# Patient Record
Sex: Female | Born: 1990 | Race: White | Hispanic: No | Marital: Single | Smoking: Former smoker
Health system: Southern US, Community
[De-identification: ages and names within clinical notes are randomized; demographics above are authoritative.]

## PROBLEM LIST (undated history)

## (undated) DIAGNOSIS — D649 Anemia, unspecified: Secondary | ICD-10-CM

## (undated) DIAGNOSIS — F419 Anxiety disorder, unspecified: Secondary | ICD-10-CM

## (undated) DIAGNOSIS — E349 Endocrine disorder, unspecified: Secondary | ICD-10-CM

## (undated) DIAGNOSIS — F32A Depression, unspecified: Secondary | ICD-10-CM

## (undated) DIAGNOSIS — F329 Major depressive disorder, single episode, unspecified: Secondary | ICD-10-CM

## (undated) DIAGNOSIS — F445 Conversion disorder with seizures or convulsions: Secondary | ICD-10-CM

## (undated) DIAGNOSIS — G40209 Localization-related (focal) (partial) symptomatic epilepsy and epileptic syndromes with complex partial seizures, not intractable, without status epilepticus: Secondary | ICD-10-CM

## (undated) DIAGNOSIS — A64 Unspecified sexually transmitted disease: Secondary | ICD-10-CM

## (undated) HISTORY — PX: TONSILLECTOMY: SUR1361

## (undated) HISTORY — DX: Localization-related (focal) (partial) symptomatic epilepsy and epileptic syndromes with complex partial seizures, not intractable, without status epilepticus: G40.209

## (undated) HISTORY — DX: Endocrine disorder, unspecified: E34.9

## (undated) HISTORY — DX: Unspecified sexually transmitted disease: A64

## (undated) HISTORY — DX: Anemia, unspecified: D64.9

---

## 2009-11-15 ENCOUNTER — Emergency Department (HOSPITAL_COMMUNITY): Admission: EM | Admit: 2009-11-15 | Discharge: 2009-11-16 | Payer: Self-pay | Admitting: Emergency Medicine

## 2010-07-24 ENCOUNTER — Emergency Department (HOSPITAL_COMMUNITY)
Admission: EM | Admit: 2010-07-24 | Discharge: 2010-07-25 | Payer: Self-pay | Source: Home / Self Care | Admitting: Emergency Medicine

## 2010-11-14 LAB — WET PREP, GENITAL

## 2010-11-14 LAB — URINALYSIS, ROUTINE W REFLEX MICROSCOPIC
Glucose, UA: NEGATIVE mg/dL
Ketones, ur: NEGATIVE mg/dL
Nitrite: NEGATIVE
Protein, ur: NEGATIVE mg/dL

## 2010-11-14 LAB — GC/CHLAMYDIA PROBE AMP, GENITAL: Chlamydia, DNA Probe: NEGATIVE

## 2010-11-14 LAB — URINE CULTURE
Colony Count: NO GROWTH
Culture: NO GROWTH

## 2010-11-14 LAB — PREGNANCY, URINE: Preg Test, Ur: NEGATIVE

## 2010-11-14 LAB — URINE MICROSCOPIC-ADD ON

## 2011-01-28 ENCOUNTER — Emergency Department (HOSPITAL_COMMUNITY)
Admission: EM | Admit: 2011-01-28 | Discharge: 2011-01-29 | Disposition: A | Discharge status: Home / Self Care | Payer: Self-pay | Attending: Emergency Medicine | Admitting: Emergency Medicine

## 2011-01-28 DIAGNOSIS — R300 Dysuria: Secondary | ICD-10-CM | POA: Insufficient documentation

## 2011-01-29 LAB — URINALYSIS, ROUTINE W REFLEX MICROSCOPIC
Leukocytes, UA: NEGATIVE
Nitrite: NEGATIVE
Specific Gravity, Urine: 1.015 (ref 1.005–1.030)
Urobilinogen, UA: 0.2 mg/dL (ref 0.0–1.0)

## 2011-01-29 LAB — URINE MICROSCOPIC-ADD ON

## 2011-01-31 LAB — URINE CULTURE
Colony Count: NO GROWTH
Culture: NO GROWTH

## 2011-03-11 ENCOUNTER — Emergency Department (HOSPITAL_COMMUNITY)
Admission: EM | Admit: 2011-03-11 | Discharge: 2011-03-11 | Disposition: A | Discharge status: Home / Self Care | Payer: Self-pay | Attending: Emergency Medicine | Admitting: Emergency Medicine

## 2011-03-11 ENCOUNTER — Encounter: Payer: Self-pay | Admitting: *Deleted

## 2011-03-11 DIAGNOSIS — M549 Dorsalgia, unspecified: Secondary | ICD-10-CM | POA: Insufficient documentation

## 2011-03-11 DIAGNOSIS — F17200 Nicotine dependence, unspecified, uncomplicated: Secondary | ICD-10-CM | POA: Insufficient documentation

## 2011-03-11 DIAGNOSIS — N390 Urinary tract infection, site not specified: Secondary | ICD-10-CM

## 2011-03-11 HISTORY — DX: Depression, unspecified: F32.A

## 2011-03-11 HISTORY — DX: Anxiety disorder, unspecified: F41.9

## 2011-03-11 HISTORY — DX: Major depressive disorder, single episode, unspecified: F32.9

## 2011-03-11 LAB — URINALYSIS, ROUTINE W REFLEX MICROSCOPIC
Ketones, ur: NEGATIVE mg/dL
Nitrite: NEGATIVE

## 2011-03-11 LAB — URINE MICROSCOPIC-ADD ON

## 2011-03-11 LAB — PREGNANCY, URINE: Preg Test, Ur: NEGATIVE

## 2011-03-11 MED ORDER — CIPROFLOXACIN HCL 250 MG PO TABS
500.0000 mg | ORAL_TABLET | Freq: Once | ORAL | Status: AC
  Administered 2011-03-11: 500 mg via ORAL
  Filled 2011-03-11 (×2): qty 2

## 2011-03-11 MED ORDER — CIPROFLOXACIN HCL 500 MG PO TABS: 500.0000 mg | ORAL_TABLET | Freq: Two times a day (BID) | ORAL | Status: AC

## 2011-03-11 MED ORDER — NITROFURANTOIN MONOHYD MACRO 100 MG PO CAPS
100.0000 mg | ORAL_CAPSULE | Freq: Two times a day (BID) | ORAL | Status: DC
  Filled 2011-03-11 (×3): qty 1

## 2011-03-11 MED ORDER — NITROFURANTOIN MACROCRYSTAL 100 MG PO CAPS
100.0000 mg | ORAL_CAPSULE | Freq: Once | ORAL | Status: AC
  Administered 2011-03-11: 100 mg via ORAL
  Filled 2011-03-11: qty 1

## 2011-03-11 MED ORDER — PHENAZOPYRIDINE HCL 100 MG PO TABS
100.0000 mg | ORAL_TABLET | Freq: Three times a day (TID) | ORAL | Status: DC
  Administered 2011-03-11 (×2): 100 mg via ORAL
  Filled 2011-03-11: qty 1

## 2011-03-11 NOTE — ED Notes (Signed)
PT states back pain x 3 days since being in the [place] and riding a boat snorkeling.  Pt states "whole spine hurts, from neck down"

## 2011-03-11 NOTE — ED Notes (Signed)
Pyridium only given x 1 dose

## 2011-03-11 NOTE — ED Notes (Signed)
Pt states she has been having back pain x 3 days. States her back started hurting past snorkling and a rough bus trip. Denies any urinary symptoms

## 2011-03-11 NOTE — ED Provider Notes (Signed)
History     Chief Complaint  Patient presents with  . Back Pain   Patient is a 20 y.o. female presenting with back pain. The history is provided by the patient. No language interpreter was used.  Back Pain  The current episode started more than 2 days ago. The problem occurs constantly. The problem has not changed since onset.The pain is associated with no known injury. The pain is present in the thoracic spine, lumbar spine and sacro-iliac joint. The quality of the pain is described as stabbing. The pain does not radiate. The pain is moderate. The symptoms are aggravated by bending. The pain is the same all the time. Pertinent negatives include no fever, no dysuria, no pelvic pain, no leg pain, no paresthesias, no paresis, no tingling and no weakness.    Past Medical History  Diagnosis Date  . Depression   . Anxiety     Past Surgical History  Procedure Date  . Tonsillectomy     No family history on file.  History  Substance Use Topics  . Smoking status: Current Everyday Smoker  . Smokeless tobacco: Not on file  . Alcohol Use: No    OB History    Grav Para Term Preterm Abortions TAB SAB Ect Mult Living                  Review of Systems  Constitutional: Negative for fever.  Genitourinary: Negative for dysuria and pelvic pain.  Musculoskeletal: Positive for back pain.       States she has bony pain fro c-soine to LS spine but most pain appears to be paravertebral and is worse with neck flexion.  Neurological: Negative for tingling, weakness and paresthesias.  All other systems reviewed and are negative.    Physical Exam  BP 122/81  Pulse 85  Temp(Src) 98.3 F (36.8 C) (Oral)  Resp 16  Ht 5\' 3"  (1.6 m)  Wt 110 lb (49.896 kg)  BMI 19.49 kg/m2  SpO2 99%  LMP 03/04/2011  Physical Exam  Nursing note and vitals reviewed. Constitutional: She is oriented to person, place, and time. Vital signs are normal. She appears well-developed and well-nourished.  HENT:    Head: Normocephalic and atraumatic.  Right Ear: External ear normal.  Left Ear: External ear normal.  Nose: Nose normal.  Mouth/Throat: No oropharyngeal exudate.  Eyes: Conjunctivae and EOM are normal. Pupils are equal, round, and reactive to light. Right eye exhibits no discharge. Left eye exhibits no discharge. No scleral icterus.  Neck: Normal range of motion. Neck supple. No JVD present. No tracheal deviation present. No thyromegaly present.  Cardiovascular: Normal rate, regular rhythm, normal heart sounds, intact distal pulses and normal pulses.  Exam reveals no gallop and no friction rub.   No murmur heard. Pulmonary/Chest: Effort normal and breath sounds normal. No stridor. No respiratory distress. She has no wheezes. She has no rales. She exhibits no tenderness.  Abdominal: Soft. Normal appearance and bowel sounds are normal. She exhibits no distension and no mass. There is no tenderness. There is no rebound and no guarding.  Genitourinary:       LMP last week.  + nuvaring  Musculoskeletal: Normal range of motion. She exhibits no edema and no tenderness.       Thoracic back: She exhibits tenderness, bony tenderness and pain. She exhibits normal range of motion, no swelling, no deformity and no spasm.       Back:  Lymphadenopathy:    She has no cervical  adenopathy.  Neurological: She is alert and oriented to person, place, and time. She has normal strength and normal reflexes. No cranial nerve deficit. Coordination normal. GCS eye subscore is 4. GCS verbal subscore is 5. GCS motor subscore is 6.  Reflex Scores:      Tricep reflexes are 2+ on the right side and 2+ on the left side.      Bicep reflexes are 2+ on the right side and 2+ on the left side.      Brachioradialis reflexes are 2+ on the right side and 2+ on the left side.      Patellar reflexes are 2+ on the right side and 2+ on the left side.      Achilles reflexes are 2+ on the right side and 2+ on the left side. Skin: Skin  is warm and dry. No rash noted. She is not diaphoretic.  Psychiatric: She has a normal mood and affect. Her speech is normal and behavior is normal. Judgment and thought content normal. Cognition and memory are normal.    ED Course  Procedures  MDM Pt appears to be in no acute distress.      Worthy Rancher, PA 03/11/11 1112

## 2011-03-12 LAB — URINE CULTURE

## 2011-03-12 NOTE — ED Provider Notes (Signed)
Evaluation and management procedures were performed by the PA/NP under my supervision/collaboration.   Dione Booze, MD 03/12/11 [phone]

## 2011-03-13 ENCOUNTER — Encounter (HOSPITAL_COMMUNITY): Payer: Self-pay

## 2011-04-14 ENCOUNTER — Emergency Department (HOSPITAL_COMMUNITY): Payer: Self-pay

## 2011-04-14 ENCOUNTER — Encounter (HOSPITAL_COMMUNITY): Payer: Self-pay | Admitting: *Deleted

## 2011-04-14 ENCOUNTER — Emergency Department (HOSPITAL_COMMUNITY)
Admission: EM | Admit: 2011-04-14 | Discharge: 2011-04-14 | Disposition: A | Discharge status: Home / Self Care | Payer: Self-pay | Attending: Emergency Medicine | Admitting: Emergency Medicine

## 2011-04-14 DIAGNOSIS — R110 Nausea: Secondary | ICD-10-CM | POA: Insufficient documentation

## 2011-04-14 DIAGNOSIS — F341 Dysthymic disorder: Secondary | ICD-10-CM | POA: Insufficient documentation

## 2011-04-14 DIAGNOSIS — F17200 Nicotine dependence, unspecified, uncomplicated: Secondary | ICD-10-CM | POA: Insufficient documentation

## 2011-04-14 DIAGNOSIS — R1011 Right upper quadrant pain: Secondary | ICD-10-CM | POA: Insufficient documentation

## 2011-04-14 DIAGNOSIS — K5900 Constipation, unspecified: Secondary | ICD-10-CM | POA: Insufficient documentation

## 2011-04-14 LAB — URINALYSIS, ROUTINE W REFLEX MICROSCOPIC
Glucose, UA: NEGATIVE mg/dL
Hgb urine dipstick: NEGATIVE
Protein, ur: NEGATIVE mg/dL

## 2011-04-14 LAB — CBC
HCT: 37.7 % (ref 36.0–46.0)
Hemoglobin: 12.9 g/dL (ref 12.0–15.0)
MCHC: 34.2 g/dL (ref 30.0–36.0)

## 2011-04-14 LAB — COMPREHENSIVE METABOLIC PANEL
ALT: 8 U/L (ref 0–35)
AST: 16 U/L (ref 0–37)
CO2: 26 mEq/L (ref 19–32)
Calcium: 10.1 mg/dL (ref 8.4–10.5)
Chloride: 105 mEq/L (ref 96–112)
GFR calc non Af Amer: >60 mL/min (ref >60)
Potassium: 3.5 mEq/L (ref 3.5–5.1)
Sodium: 141 mEq/L (ref 135–145)
Total Bilirubin: 0.4 mg/dL (ref 0.3–1.2)

## 2011-04-14 LAB — DIFFERENTIAL
Basophils Absolute: 0 10*3/uL (ref 0.0–0.1)
Lymphocytes Relative: 41 % (ref 12–46)
Neutro Abs: 3.7 10*3/uL (ref 1.7–7.7)
Neutrophils Relative %: 51 % (ref 43–77)

## 2011-04-14 LAB — PREGNANCY, URINE: Preg Test, Ur: NEGATIVE

## 2011-04-14 MED ORDER — MORPHINE SULFATE 4 MG/ML IJ SOLN
4.0000 mg | Freq: Once | INTRAMUSCULAR | Status: AC
  Administered 2011-04-14: 4 mg via INTRAVENOUS
  Filled 2011-04-14: qty 1

## 2011-04-14 MED ORDER — SODIUM CHLORIDE 0.9 % IV SOLN
20.0000 mL | INTRAVENOUS | Status: DC
  Administered 2011-04-14: 20 mL via INTRAVENOUS

## 2011-04-14 MED ORDER — ONDANSETRON HCL 4 MG/2ML IJ SOLN
4.0000 mg | Freq: Once | INTRAMUSCULAR | Status: AC
  Administered 2011-04-14: 4 mg via INTRAVENOUS
  Filled 2011-04-14: qty 2

## 2011-04-14 MED ORDER — POLYETHYLENE GLYCOL 3350 17 GM/SCOOP PO POWD: 17.0000 g | Freq: Every day | ORAL | Status: AC

## 2011-04-14 NOTE — ED Notes (Signed)
Meal tray provided per pt request.  

## 2011-04-14 NOTE — ED Notes (Signed)
Resting quietly in bed. Resp even and unlabored. Denies pain. NAd noted.

## 2011-04-14 NOTE — ED Notes (Signed)
Pt c/o RUQ abdominal pain and nausea off and on x 3 weeks. Also c/o "feeling full", loss of appetite and worsening pain after eating. Denies vomiting.

## 2011-04-14 NOTE — ED Provider Notes (Signed)
History     CSN: [id number] Arrival date & time: 04/14/2011  2:20 PM  Chief Complaint  Patient presents with  . Abdominal Pain   Patient is a 20 y.o. female presenting with abdominal pain. The history is provided by the patient.  Abdominal Pain The primary symptoms of the illness include abdominal pain and nausea. The primary symptoms of the illness do not include fever, shortness of breath, vomiting or diarrhea. The current episode started more than 2 days ago (She describes intermittent chronic right upper abdominal pain for the past year,  but worse for the past 3 weeks.). The onset of the illness was gradual. The problem has been gradually worsening.  The patient states that she believes she is currently not pregnant. The patient has had a change in bowel habit (constipation.  Describes having hard stools,  no bm in 2 days.  Alternates with diarrhea,  but none in ). Additional symptoms associated with the illness include constipation.    Past Medical History  Diagnosis Date  . Depression   . Anxiety     Past Surgical History  Procedure Date  . Tonsillectomy     History reviewed. No pertinent family history.  History  Substance Use Topics  . Smoking status: Current Everyday Smoker  . Smokeless tobacco: Not on file  . Alcohol Use: No    OB History    Grav Para Term Preterm Abortions TAB SAB Ect Mult Living                  Review of Systems  Constitutional: Negative for fever.  HENT: Negative for congestion, sore throat and neck pain.   Eyes: Negative.   Respiratory: Negative for chest tightness and shortness of breath.   Cardiovascular: Negative for chest pain.  Gastrointestinal: Positive for nausea, abdominal pain and constipation. Negative for vomiting, diarrhea, anal bleeding and rectal pain.  Genitourinary: Negative.   Musculoskeletal: Negative for joint swelling and arthralgias.  Skin: Negative.  Negative for rash and wound.  Neurological: Negative for  dizziness, weakness, light-headedness, numbness and headaches.  Hematological: Negative.   Psychiatric/Behavioral: Negative.     Physical Exam  BP 118/70  Pulse 70  Temp(Src) 98.2 F (36.8 C) (Oral)  Resp 17  Ht 5\' 4"  (1.626 m)  Wt 106 lb (48.081 kg)  BMI 18.19 kg/m2  SpO2 100%  LMP 03/13/2011  Physical Exam  Nursing note and vitals reviewed. Constitutional: She is oriented to person, place, and time. She appears well-developed and well-nourished.  HENT:  Head: Normocephalic and atraumatic.  Eyes: Conjunctivae are normal.  Neck: Normal range of motion.  Cardiovascular: Normal rate, regular rhythm, normal heart sounds and intact distal pulses.   Pulmonary/Chest: Effort normal and breath sounds normal. She has no wheezes.  Abdominal: Soft. Bowel sounds are normal. She exhibits no distension. There is no hepatosplenomegaly. There is tenderness in the right upper quadrant. There is no rebound, no CVA tenderness, no tenderness at McBurney's point and negative Murphy's sign. No hernia.  Musculoskeletal: Normal range of motion.  Neurological: She is alert and oriented to person, place, and time.  Skin: Skin is warm and dry.  Psychiatric: She has a normal mood and affect.    ED Course  Procedures  MDM Patients labs and/or radiological studies were reviewed during the medical decision making and disposition process.  Chronic intermittent abdominal pain with no signs or labwork findings for acute or emergent process.    Suspect possible ibs/constipation predominant with hepatic flexure gas  trapping causing intermittent pain.  No family history of UC/ crohns.  Referral to GI for further eval.      Candis Musa, PA 04/14/11 2008

## 2011-04-15 NOTE — ED Provider Notes (Signed)
History/physical exam/procedure(s) were performed by non-physician practitioner and as supervising physician I was immediately available for consultation/collaboration. I have reviewed all notes and am in agreement with care and plan.   Hilario Quarry, MD 04/15/11 [phone]

## 2011-05-02 ENCOUNTER — Ambulatory Visit (INDEPENDENT_AMBULATORY_CARE_PROVIDER_SITE_OTHER): Payer: Self-pay | Admitting: Gastroenterology

## 2011-05-02 ENCOUNTER — Encounter: Payer: Self-pay | Admitting: Gastroenterology

## 2011-05-02 VITALS — BP 118/71 | HR 92 | Temp 98.8°F | Ht 64.0 in | Wt 106.6 lb

## 2011-05-02 DIAGNOSIS — K625 Hemorrhage of anus and rectum: Secondary | ICD-10-CM

## 2011-05-02 NOTE — Progress Notes (Signed)
Primary Care Physician:  No primary provider on file. Primary Gastroenterologist:  Dr. Darrick Penna  Chief Complaint  Patient presents with  . Constipation    HPI:   [name] is a pleasant 20 year old female who presents with chronic constipation. She states she is unable to tolerate Miralax, as it makes her nauseated. She is in the process of obtaining [facility]. She reports rectal bleeding for the past 2 weeks with each bowel movement. Milk causes diffuse abdominal discomfort, with "gurgling/bubbling". She reports constant discomfort diffusely, with no relief after a BM. Reports constant feelings of fullness. Nausea is underlying. She has lost 9 lbs. She rarely uses Ibuprofen, only for headaches.    8/24 labs: no anemia, normal LFTs, normal lipase. US WNL.    Past Medical History  Diagnosis Date  . Depression   . Anxiety   . Anemia     as child    Past Surgical History  Procedure Date  . Tonsillectomy     Current Outpatient Prescriptions  Medication Sig Dispense Refill  . PARoxetine (PAXIL) 20 MG tablet Take 20 mg by mouth every morning.          Allergies as of 05/02/2011  . (No Known Allergies)    Family History  Problem Relation Age of Onset  . Colon cancer Neg Hx     History   Social History  . Marital Status: Single    Spouse Name: N/A    Number of Children: 0  . Years of Education: N/A   Occupational History  . unemployed     looking for work   Social History Main Topics  . Smoking status: Current Everyday Smoker -- 0.5 packs/day    Types: Cigarettes  . Smokeless tobacco: Not on file  . Alcohol Use: No  . Drug Use: No  . Sexually Active: Yes    Birth Control/ Protection: Injection     Review of Systems: Gen: Denies any fever, chills, fatigue, + weight loss, lack of appetite.  CV: Denies chest pain, heart palpitations, peripheral edema, syncope.  Resp: Denies shortness of breath at rest or with exertion. Denies wheezing or  cough.  GI: Denies dysphagia or odynophagia. Denies jaundice, hematemesis, fecal incontinence. GU : Denies urinary burning, urinary frequency, urinary hesitancy MS: Denies joint pain, muscle weakness, cramps, or limitation of movement.  Derm: Denies rash, itching, dry skin Psych: Denies depression, anxiety, memory loss, and confusion Heme: Denies bruising, bleeding, and enlarged lymph nodes.  Physical Exam: BP 118/71  Pulse 92  Temp(Src) 98.8 F (37.1 C) (Temporal)  Ht 5\' 4"  (1.626 m)  Wt 106 lb 9.6 oz (48.353 kg)  BMI 18.30 kg/m2  LMP 03/13/2011 General:   Alert and oriented. Pleasant and cooperative. Slender but appears well-developed.  Head:  Normocephalic and atraumatic. Eyes:  Without icterus, sclera clear and conjunctiva pink.  Ears:  Normal auditory acuity. Nose:  No deformity, discharge,  or lesions. Mouth:  No deformity or lesions, oral mucosa pink.  Neck:  Supple, without mass or thyromegaly. Lungs:  Clear to auscultation bilaterally. No wheezes, rales, or rhonchi. No distress.  Heart:  S1, S2 present without murmurs appreciated.  Abdomen:  +BS, soft, mildly tender/described as sore diffusely.  non-distended. No HSM noted. No guarding or rebound. No masses appreciated.  Rectal:  Deferred  Msk:  Symmetrical without gross deformities. Normal posture. Pulses:  Normal pulses noted. Extremities:  Without clubbing or edema. Neurologic:  Alert and  oriented x4;  grossly normal neurologically. Skin:  Intact without significant lesions or rashes. Cervical Nodes:  No significant cervical adenopathy. Psych:  Alert and cooperative. Normal mood and affect.

## 2011-05-02 NOTE — Patient Instructions (Signed)
We will be setting you up for a colonoscopy with Dr. Darrick Penna in the near future.  If you have severe abdominal pain or severe rectal bleeding, seek medical attention.  In the meantime, continue high fiber diet, drink 6-8 glasses of water per day.

## 2011-05-03 ENCOUNTER — Emergency Department (HOSPITAL_COMMUNITY)
Admission: EM | Admit: 2011-05-03 | Discharge: 2011-05-03 | Disposition: A | Discharge status: Home / Self Care | Payer: Self-pay | Attending: Emergency Medicine | Admitting: Emergency Medicine

## 2011-05-03 ENCOUNTER — Other Ambulatory Visit: Payer: Self-pay | Admitting: Gastroenterology

## 2011-05-03 ENCOUNTER — Encounter (HOSPITAL_COMMUNITY): Payer: Self-pay | Admitting: *Deleted

## 2011-05-03 DIAGNOSIS — K625 Hemorrhage of anus and rectum: Secondary | ICD-10-CM

## 2011-05-03 DIAGNOSIS — F341 Dysthymic disorder: Secondary | ICD-10-CM | POA: Insufficient documentation

## 2011-05-03 DIAGNOSIS — F17200 Nicotine dependence, unspecified, uncomplicated: Secondary | ICD-10-CM | POA: Insufficient documentation

## 2011-05-03 DIAGNOSIS — K602 Anal fissure, unspecified: Secondary | ICD-10-CM | POA: Insufficient documentation

## 2011-05-03 LAB — DIFFERENTIAL
Lymphocytes Relative: 40 % (ref 12–46)
Lymphs Abs: 2.9 10*3/uL (ref 0.7–4.0)
Monocytes Absolute: 0.3 10*3/uL (ref 0.1–1.0)
Monocytes Relative: 4 % (ref 3–12)
Neutro Abs: 3.8 10*3/uL (ref 1.7–7.7)

## 2011-05-03 LAB — CBC
HCT: 39.2 % (ref 36.0–46.0)
Hemoglobin: 13.5 g/dL (ref 12.0–15.0)
MCHC: 34.4 g/dL (ref 30.0–36.0)

## 2011-05-03 NOTE — ED Notes (Signed)
Pt c/o having rectal bleeding that started today; pt states she has been to see a gastroenterologist and is scheduled for a colonoscopy on 9/25, but was told if she has bleeding to come to ED; pt states she is having abd pain and the blood is bright red

## 2011-05-03 NOTE — Assessment & Plan Note (Signed)
20 year old female with history of chronic constipation, now with new onset of rectal bleeding for the past few weeks, mild to moderate amount. Abdominal discomfort noted diffusely, with no relief after BM. Reports gurgling/bubbling in abdomen with milk products. Feeling of fullness, underlying nausea. Concern for IBD; may have element of IBS, lactose intolerance, with hematochezia r/t benign anorectal source. However, needs lower GI evaluation.   Proceed with colonoscopy with Dr. Darrick Penna in the near future. The risks, benefits, and alternatives have been discussed in detail with the patient. They state understanding and desire to proceed.  Continue high fiber diet, 6-8 glasses of water daily Avoid NSAIDs Lactose-free diet (as patient has already started) To ED if severe abdominal pain, N/V, increased rectal bleeding

## 2011-05-03 NOTE — ED Provider Notes (Addendum)
History   Chart scribed for Geoffery Lyons, MD by Enos Fling; the patient was seen in room APA17/AP[facility]17; this patient's care was started at 9:16 PM.    CSN: [id number] Arrival date & time: 05/03/2011  8:54 PM  Chief Complaint  Patient presents with  . Rectal Bleeding   HPI Kayla Hardy is a 20 y.o. female who presents to the Emergency Department complaining of rectal bleeding. Pt reports GI issues for past year, seen by GI yesterday and colonoscopy scheduled in approx 2 weeks because of chronic constipation, pain and straining with BMs, sometimes with small amount of red blood. No BM today, but pt passed bright red blood from rectum while urinating a brief time ago. Pt has tried miralax for constipation but no longer take it because causes nausea. Pt also with c/o constant abd pain that is unchanged from usual. Denies dizziness, lightheadedness, or sob. No other complaints.   Past Medical History  Diagnosis Date  . Depression   . Anxiety   . Anemia     as child    Past Surgical History  Procedure Date  . Tonsillectomy     Family History  Problem Relation Age of Onset  . Colon cancer Neg Hx     History  Substance Use Topics  . Smoking status: Current Everyday Smoker -- 0.5 packs/day    Types: Cigarettes  . Smokeless tobacco: Not on file  . Alcohol Use: No    OB History    Grav Para Term Preterm Abortions TAB SAB Ect Mult Living                 Previous Medications   MEDROXYPROGESTERONE (DEPO-PROVERA) 150 MG/ML INJECTION    Inject 150 mg into the muscle every 3 (three) months.     PAROXETINE (PAXIL) 20 MG TABLET    Take 20 mg by mouth at bedtime.      Allergies as of 05/03/2011  . (No Known Allergies)     Review of Systems 10 Systems reviewed and are negative for acute change except as noted in the HPI.  Physical Exam  BP 120/64  Pulse 72  Temp(Src) 98.9 F (37.2 C) (Oral)  Resp 20  Ht 5\' 4"  (1.626 m)  Wt 106 lb (48.081 kg)  BMI 18.19 kg/m2   SpO2 100%  LMP 03/13/2011  Physical Exam  Nursing note and vitals reviewed. Constitutional: She is oriented to person, place, and time. She appears well-developed and well-nourished. No distress.  HENT:  Head: Normocephalic.  Mouth/Throat: Oropharynx is clear and moist and mucous membranes are normal.  Eyes: Conjunctivae are normal.  Neck: Normal range of motion. Neck supple.  Cardiovascular: Normal rate, regular rhythm and intact distal pulses.  Exam reveals no gallop and no friction rub.   No murmur heard. Pulmonary/Chest: Effort normal and breath sounds normal. She has no wheezes. She has no rales.  Abdominal: Soft. Bowel sounds are normal. She exhibits no distension. There is no tenderness.  Genitourinary:       Fissure to posterior aspect of rectum with tenderness, no hemorrhoids, chaperone present during exam  Musculoskeletal: Normal range of motion.  Neurological: She is alert and oriented to person, place, and time.  Skin: Skin is warm and dry. No rash noted.  Psychiatric: She has a normal mood and affect.      Procedures - none   OTHER DATA REVIEWED: Nursing notes and vital signs reviewed.   LABS / RADIOLOGY: Results for orders placed during the hospital  encounter of 05/03/11  CBC      Component Value Range   WBC 7.1  4.0 - 10.5 (K/uL)   RBC 4.19  3.87 - 5.11 (MIL/uL)   Hemoglobin 13.5  12.0 - 15.0 (g/dL)   HCT 40.9  81.1 - 91.4 (%)   MCV 93.6  78.0 - 100.0 (fL)   MCH 32.2  26.0 - 34.0 (pg)   MCHC 34.4  30.0 - 36.0 (g/dL)   RDW 78.2  95.6 - 21.3 (%)   Platelets 237  150 - 400 (K/uL)  DIFFERENTIAL      Component Value Range   Neutrophils Relative 53  43 - 77 (%)   Neutro Abs 3.8  1.7 - 7.7 (K/uL)   Lymphocytes Relative 40  12 - 46 (%)   Lymphs Abs 2.9  0.7 - 4.0 (K/uL)   Monocytes Relative 4  3 - 12 (%)   Monocytes Absolute 0.3  0.1 - 1.0 (K/uL)   Eosinophils Relative 2  0 - 5 (%)   Eosinophils Absolute 0.2  0.0 - 0.7 (K/uL)   Basophils Relative 0  0 - 1  (%)   Basophils Absolute 0.0  0.0 - 0.1 (K/uL)     ED COURSE: All results reviewed and discussed with pt, questions answered, pt agreeable with plan.   MDM: Labs are okay.  No wbc, no anemia.  Patient has obvious fissure.  Will treat with fiber, stool softeners.  IMPRESSION: No diagnosis found.   DISCHARGE MEDICATIONS: New Prescriptions   No medications on file     SCRIBE ATTESTATION: I personally performed the services described in this documentation, which was scribed in my presence. The recorded information has been reviewed and considered. Geoffery Lyons, MD       Geoffery Lyons, MD 05/03/11 0865  Geoffery Lyons, MD 05/03/11 [phone]

## 2011-05-04 NOTE — Progress Notes (Signed)
No PCP on file 

## 2011-05-15 MED ORDER — SODIUM CHLORIDE 0.45 % IV SOLN
Freq: Once | INTRAVENOUS | Status: AC
Start: 2011-05-15 — End: 2011-05-16
  Administered 2011-05-16: 14:00:00 via INTRAVENOUS

## 2011-05-15 NOTE — Progress Notes (Signed)
TCS 9/25 REVIEWED. AGREE.

## 2011-05-16 ENCOUNTER — Telehealth: Payer: Self-pay

## 2011-05-16 ENCOUNTER — Ambulatory Visit (HOSPITAL_COMMUNITY)
Admission: RE | Admit: 2011-05-16 | Discharge: 2011-05-16 | Disposition: A | Discharge status: Home / Self Care | Payer: Self-pay | Source: Ambulatory Visit | Attending: Gastroenterology | Admitting: Gastroenterology

## 2011-05-16 ENCOUNTER — Encounter (HOSPITAL_COMMUNITY)
Admission: RE | Disposition: A | Discharge status: Home / Self Care | Payer: Self-pay | Source: Ambulatory Visit | Attending: Gastroenterology

## 2011-05-16 ENCOUNTER — Encounter (HOSPITAL_COMMUNITY): Payer: Self-pay

## 2011-05-16 DIAGNOSIS — K625 Hemorrhage of anus and rectum: Secondary | ICD-10-CM

## 2011-05-16 DIAGNOSIS — K5909 Other constipation: Secondary | ICD-10-CM | POA: Insufficient documentation

## 2011-05-16 DIAGNOSIS — K648 Other hemorrhoids: Secondary | ICD-10-CM | POA: Insufficient documentation

## 2011-05-16 DIAGNOSIS — K921 Melena: Secondary | ICD-10-CM | POA: Insufficient documentation

## 2011-05-16 HISTORY — PX: COLONOSCOPY: SHX5424

## 2011-05-16 SURGERY — COLONOSCOPY
Anesthesia: Moderate Sedation

## 2011-05-16 MED ORDER — DIPHENHYDRAMINE HCL 50 MG/ML IJ SOLN
INTRAMUSCULAR | Status: AC
  Filled 2011-05-16: qty 1

## 2011-05-16 MED ORDER — MIDAZOLAM HCL 5 MG/5ML IJ SOLN
INTRAMUSCULAR | Status: DC | PRN
  Administered 2011-05-16: 2 mg via INTRAVENOUS
  Administered 2011-05-16: 1 mg via INTRAVENOUS
  Administered 2011-05-16: 2 mg via INTRAVENOUS

## 2011-05-16 MED ORDER — MEPERIDINE HCL 100 MG/ML IJ SOLN
INTRAMUSCULAR | Status: AC
  Filled 2011-05-16: qty 2

## 2011-05-16 MED ORDER — DIPHENHYDRAMINE HCL 50 MG/ML IJ SOLN
INTRAMUSCULAR | Status: DC | PRN
  Administered 2011-05-16: 12.5 mg via INTRAVENOUS

## 2011-05-16 MED ORDER — FENTANYL CITRATE 0.05 MG/ML IJ SOLN
INTRAMUSCULAR | Status: DC | PRN
  Administered 2011-05-16: 25 ug via INTRAVENOUS

## 2011-05-16 MED ORDER — MEPERIDINE HCL 100 MG/ML IJ SOLN
INTRAMUSCULAR | Status: DC | PRN
  Administered 2011-05-16: 50 mg via INTRAVENOUS
  Administered 2011-05-16: 25 mg via INTRAVENOUS

## 2011-05-16 MED ORDER — LUBIPROSTONE 24 MCG PO CAPS: 24.0000 ug | ORAL_CAPSULE | Freq: Two times a day (BID) | ORAL | Status: AC

## 2011-05-16 MED ORDER — FENTANYL CITRATE 0.05 MG/ML IJ SOLN
INTRAMUSCULAR | Status: AC
  Filled 2011-05-16: qty 2

## 2011-05-16 MED ORDER — MIDAZOLAM HCL 5 MG/5ML IJ SOLN
INTRAMUSCULAR | Status: AC
  Filled 2011-05-16: qty 10

## 2011-05-16 MED ORDER — STERILE WATER FOR IRRIGATION IR SOLN
Status: DC | PRN
  Administered 2011-05-16: 14:00:00

## 2011-05-16 NOTE — H&P (Signed)
03/13/2011                    Progress Notes     Gerrit Halls, NP  05/03/2011  4:47 PM  Signed     Primary Care Physician:  No primary provider on file. Primary Gastroenterologist:  Dr. Darrick Penna    Chief Complaint   Patient presents with   .  Constipation      HPI:    Kayla Hardy is a pleasant 20 year old female who presents with chronic constipation. She states she is unable to tolerate Miralax, as it makes her nauseated. She is in the process of obtaining [facility]. She reports rectal bleeding for the past 2 weeks with each bowel movement. Milk causes diffuse abdominal discomfort, with "gurgling/bubbling". She reports constant discomfort diffusely, with no relief after a BM. Reports constant feelings of fullness. Nausea is underlying. She has lost 9 lbs. She rarely uses Ibuprofen, only for headaches.     8/24 labs: no anemia, normal LFTs, normal lipase. US WNL.       Past Medical History   Diagnosis  Date   .  Depression     .  Anxiety     .  Anemia         as child       Past Surgical History   Procedure  Date   .  Tonsillectomy         Current Outpatient Prescriptions   Medication  Sig  Dispense  Refill   .  PARoxetine (PAXIL) 20 MG tablet  Take 20 mg by mouth every morning.               Allergies as of 05/02/2011   .  (No Known Allergies)       Family History   Problem  Relation  Age of Onset   .  Colon cancer  Neg Hx         History       Social History   .  Marital Status:  Single       Spouse Name:  N/A       Number of Children:  0   .  Years of Education:  N/A       Occupational History   .  unemployed         looking for work       Social History Main Topics   .  Smoking status:  Current Everyday Smoker -- 0.5 packs/day       Types:  Cigarettes   .  Smokeless tobacco:  Not on file   .  Alcohol Use:  No   .  Drug Use:  No   .  Sexually Active:  Yes       Birth Control/ Protection:  Injection           Review of Systems: Gen: Denies any fever, chills, fatigue, + weight loss, lack of appetite.   CV: Denies chest pain, heart palpitations, peripheral edema, syncope.   Resp: Denies shortness of breath at rest or with exertion. Denies wheezing or cough.   GI: Denies dysphagia or odynophagia. Denies jaundice, hematemesis, fecal incontinence. GU : Denies urinary burning, urinary frequency, urinary hesitancy MS: Denies joint pain, muscle weakness, cramps, or limitation of movement.   Derm: Denies rash, itching, dry skin Psych: Denies depression, anxiety, memory loss, and confusion Heme: Denies bruising, bleeding,  and enlarged lymph nodes.   Physical Exam: BP 118/71  Pulse 92  Temp(Src) 98.8 F (37.1 C) (Temporal)  Ht 5\' 4"  (1.626 m)  Wt 106 lb 9.6 oz (48.353 kg)  BMI 18.30 kg/m2  LMP 03/13/2011 General:   Alert and oriented. Pleasant and cooperative. Slender but appears well-developed.   Head:  Normocephalic and atraumatic. Eyes:  Without icterus, sclera clear and conjunctiva pink.   Ears:  Normal auditory acuity. Nose:  No deformity, discharge,  or lesions. Mouth:  No deformity or lesions, oral mucosa pink.   Neck:  Supple, without mass or thyromegaly. Lungs:  Clear to auscultation bilaterally. No wheezes, rales, or rhonchi. No distress.   Heart:  S1, S2 present without murmurs appreciated.   Abdomen:  +BS, soft, mildly tender/described as sore diffusely.  non-distended. No HSM noted. No guarding or rebound. No masses appreciated.   Rectal:  Deferred   Msk:  Symmetrical without gross deformities. Normal posture. Pulses:  Normal pulses noted. Extremities:  Without clubbing or edema. Neurologic:  Alert and  oriented x4;  grossly normal neurologically. Skin:  Intact without significant lesions or rashes. Cervical Nodes:  No significant cervical adenopathy. Psych:  Alert and cooperative. Normal mood and affect.         Kayla Hardy  05/04/2011  7:49 AM  Signed No  PCP on file  Jonette Eva, MD  05/15/2011 12:35 PM  Signed TCS 9/25 REVIEWED. AGREE.           Rectal bleeding - Gerrit Halls, NP  05/03/2011  4:47 PM  Signed 20 year old female with history of chronic constipation, now with new onset of rectal bleeding for the past few weeks, mild to moderate amount. Abdominal discomfort noted diffusely, with no relief after BM. Reports gurgling/bubbling in abdomen with milk products. Feeling of fullness, underlying nausea. Concern for IBD; may have element of IBS, lactose intolerance, with hematochezia r/t benign anorectal source. However, needs lower GI evaluation.    Proceed with colonoscopy with Dr. Darrick Penna in the near future. The risks, benefits, and alternatives have been discussed in detail with the patient. They state understanding and desire to proceed.  Continue high fiber diet, 6-8 glasses of water daily Avoid NSAIDs Lactose-free diet (as patient has already started) To ED if severe abdominal pain, N/V, increased rectal bleeding

## 2011-05-16 NOTE — Progress Notes (Signed)
Pt's chest, neck and face started breaking out in red spots. Pt denies itching, SOB, or difficulty breathing. MD notified due to increasing redness. VSS stable. MD order to give Benadryl 12.5mg  at 1433. Medication given. Will continue to monitor pt.

## 2011-05-16 NOTE — Telephone Encounter (Signed)
REVIEWED. AGREE. 

## 2011-05-16 NOTE — Interval H&P Note (Signed)
History and Physical Interval Note:   05/16/2011   2:16 PM   Kayla Hardy  has presented today for surgery, with the diagnosis of rectal bleeding & abd pain  The various methods of treatment have been discussed with the patient and family. After consideration of risks, benefits and other options for treatment, the patient has consented to  Procedure(s): COLONOSCOPY as a surgical intervention .  I have reviewed the patients' chart and labs.  Questions were answered to the patient's satisfaction.     Jonette Eva  MD

## 2011-05-16 NOTE — Telephone Encounter (Signed)
Samples of Amitiza (2 boxes, # 8 tablets) left at front for pickup per Dr. Darrick Penna.

## 2011-05-17 DIAGNOSIS — K625 Hemorrhage of anus and rectum: Secondary | ICD-10-CM

## 2011-05-17 DIAGNOSIS — K648 Other hemorrhoids: Secondary | ICD-10-CM

## 2011-05-24 ENCOUNTER — Encounter (HOSPITAL_COMMUNITY): Payer: Self-pay | Admitting: Gastroenterology

## 2011-06-08 ENCOUNTER — Emergency Department (HOSPITAL_COMMUNITY): Payer: Self-pay

## 2011-06-08 ENCOUNTER — Emergency Department (HOSPITAL_COMMUNITY)
Admission: EM | Admit: 2011-06-08 | Discharge: 2011-06-08 | Disposition: A | Discharge status: Home / Self Care | Payer: Self-pay | Attending: Emergency Medicine | Admitting: Emergency Medicine

## 2011-06-08 ENCOUNTER — Encounter (HOSPITAL_COMMUNITY): Payer: Self-pay | Admitting: Emergency Medicine

## 2011-06-08 DIAGNOSIS — F329 Major depressive disorder, single episode, unspecified: Secondary | ICD-10-CM | POA: Insufficient documentation

## 2011-06-08 DIAGNOSIS — F411 Generalized anxiety disorder: Secondary | ICD-10-CM | POA: Insufficient documentation

## 2011-06-08 DIAGNOSIS — R569 Unspecified convulsions: Secondary | ICD-10-CM | POA: Insufficient documentation

## 2011-06-08 DIAGNOSIS — F3289 Other specified depressive episodes: Secondary | ICD-10-CM | POA: Insufficient documentation

## 2011-06-08 DIAGNOSIS — R259 Unspecified abnormal involuntary movements: Secondary | ICD-10-CM | POA: Insufficient documentation

## 2011-06-08 DIAGNOSIS — F17200 Nicotine dependence, unspecified, uncomplicated: Secondary | ICD-10-CM | POA: Insufficient documentation

## 2011-06-08 DIAGNOSIS — R062 Wheezing: Secondary | ICD-10-CM | POA: Insufficient documentation

## 2011-06-08 DIAGNOSIS — F419 Anxiety disorder, unspecified: Secondary | ICD-10-CM

## 2011-06-08 LAB — DIFFERENTIAL
Eosinophils Relative: 2 % (ref 0–5)
Lymphocytes Relative: 20 % (ref 12–46)
Monocytes Absolute: 0.5 10*3/uL (ref 0.1–1.0)
Monocytes Relative: 4 % (ref 3–12)
Neutro Abs: 8.6 10*3/uL — ABNORMAL HIGH (ref 1.7–7.7)

## 2011-06-08 LAB — BASIC METABOLIC PANEL
BUN: 6 mg/dL (ref 6–23)
CO2: 27 mEq/L (ref 19–32)
Chloride: 104 mEq/L (ref 96–112)
GFR calc Af Amer: >90 mL/min (ref >90)
Glucose, Bld: 100 mg/dL — ABNORMAL HIGH (ref 70–99)
Potassium: 3.5 mEq/L (ref 3.5–5.1)

## 2011-06-08 LAB — URINALYSIS, ROUTINE W REFLEX MICROSCOPIC
Bilirubin Urine: NEGATIVE
Glucose, UA: NEGATIVE mg/dL
Specific Gravity, Urine: 1.02 (ref 1.005–1.030)
pH: 5.5 (ref 5.0–8.0)

## 2011-06-08 LAB — URINE MICROSCOPIC-ADD ON

## 2011-06-08 LAB — CBC
HCT: 39.9 % (ref 36.0–46.0)
Hemoglobin: 13.6 g/dL (ref 12.0–15.0)
MCHC: 34.1 g/dL (ref 30.0–36.0)
MCV: 94.5 fL (ref 78.0–100.0)
WBC: 11.8 10*3/uL — ABNORMAL HIGH (ref 4.0–10.5)

## 2011-06-08 MED ORDER — LORAZEPAM 1 MG PO TABS: 1.0000 mg | ORAL_TABLET | Freq: Three times a day (TID) | ORAL | Status: AC | PRN

## 2011-06-08 MED ORDER — SODIUM CHLORIDE 0.9 % IV BOLUS (SEPSIS)
1000.0000 mL | Freq: Once | INTRAVENOUS | Status: AC
  Administered 2011-06-08: 1000 mL via INTRAVENOUS

## 2011-06-08 NOTE — ED Notes (Signed)
Pt stated she had 3 seizures today

## 2011-06-08 NOTE — ED Notes (Signed)
Called into room due to patient shaking for possible seizure, pt able to open eyes during the shaking but not able to talk, activity lasted about 30-45 seconds, patient alert but unable to talk at this time

## 2011-06-08 NOTE — ED Notes (Signed)
Pt displaying purposeful rhytmic movement in the attempt to emulate seizure like activity, pt stopped when politely asked to. vss

## 2011-06-08 NOTE — ED Notes (Signed)
Pt stating no needs prior to leaving er  

## 2011-06-08 NOTE — ED Notes (Signed)
Pt again moving her arms and legs in rhythmic motion immediately waking up and talking to people at bedside.

## 2011-06-08 NOTE — ED Notes (Signed)
Pt stating she began having these seizures 5 minutes after getting the depo shot in the dr's office.

## 2011-06-08 NOTE — ED Provider Notes (Signed)
History    Scribed for Kayla Lennert, MD, the patient was seen in room APA02/APA02. This chart was scribed by Katha Cabal.   CSN: [id number] Arrival date & time: 06/08/2011  6:37 PM   First MD Initiated Contact with Patient 06/08/11 2041      Chief Complaint  Patient presents with  . Seizures    (Consider location/radiation/quality/duration/timing/severity/associated sxs/prior treatment) HPI Kayla Hardy is a 20 y.o. female who presents to the Emergency Department complaining of seizure. Patient felt like seizures bilateral upper and lower extremity tremors and convulsions .  Patient reports LOC.  Denies tongue biting and urinary incontinence.  Patient takes 20 mg Paxil and Depo Provera.   Patient reports 3-4 seizures this afternoon and 3 seizures while in the ED.   Patient states that seizures began after taking Depo Provera and adds that PCP ruled out Depo Provera and Paxil as cause of seizures and told that seizure episodes where anxiety related.  Patient reports increased stress.    Patient is a smoker.   No current PCP.    Past Medical History  Diagnosis Date  . Depression   . Anxiety   . Anemia     as child    Past Surgical History  Procedure Date  . Tonsillectomy   . Colonoscopy 05/16/2011    Procedure: COLONOSCOPY;  Surgeon: Arlyce Harman, MD;  Location: [facility];  Service: Endoscopy;  Laterality: N/A;  12:10    Family History  Problem Relation Age of Onset  . Colon cancer Neg Hx     History  Substance Use Topics  . Smoking status: Current Everyday Smoker -- 0.5 packs/day    Types: Cigarettes  . Smokeless tobacco: Not on file  . Alcohol Use: No    OB History    Grav Para Term Preterm Abortions TAB SAB Ect Mult Living                  Review of Systems  Constitutional: Negative for fatigue.  HENT: Negative for congestion, sinus pressure and ear discharge.   Eyes: Negative for discharge.  Respiratory: Negative for cough.     Cardiovascular: Negative for chest pain.  Gastrointestinal: Negative for abdominal pain and diarrhea.  Genitourinary: Negative for frequency and hematuria.  Musculoskeletal: Negative for back pain.  Skin: Negative for rash.  Neurological: Positive for tremors and seizures. Negative for headaches.  Hematological: Negative.   Psychiatric/Behavioral: Negative for hallucinations.    Allergies  Demerol and Lactaid  Home Medications   Current Outpatient Rx  Name Route Sig Dispense Refill  . LUBIPROSTONE 24 MCG PO CAPS Oral Take 1 capsule (24 mcg total) by mouth 2 (two) times daily with a meal. 60 capsule 11  . OVER THE COUNTER MEDICATION Oral Take 1 capsule by mouth daily. DIGESTIVE ADVANTAGE: Lactose Defense Formula     . PAROXETINE HCL 20 MG PO TABS Oral Take 20 mg by mouth at bedtime.     . IBUPROFEN 200 MG PO TABS Oral Take 400 mg by mouth every 6 (six) hours as needed. For pain     . LORAZEPAM 1 MG PO TABS Oral Take 1 tablet (1 mg total) by mouth 3 (three) times daily as needed for anxiety. 30 tablet 0  . MEDROXYPROGESTERONE ACETATE 150 MG/ML IM SUSP Intramuscular Inject 150 mg into the muscle every 3 (three) months.        BP 96/56  Pulse 79  Temp(Src) 98.2 F (36.8 C) (Oral)  Resp  16  Ht 5\' 4"  (1.626 m)  Wt 115 lb (52.164 kg)  BMI 19.74 kg/m2  SpO2 100%  Physical Exam  Constitutional: She is oriented to person, place, and time. She appears well-developed.  HENT:  Head: Normocephalic and atraumatic.  Eyes: Conjunctivae and EOM are normal. Pupils are equal, round, and reactive to light. No scleral icterus.  Neck: Neck supple. No thyromegaly present.  Cardiovascular: Normal rate and regular rhythm.  Exam reveals no gallop and no friction rub.   No murmur heard. Pulmonary/Chest: Effort normal. No stridor. She has wheezes (mild on inspiration ). She has no rales. She exhibits no tenderness.  Abdominal: Soft. She exhibits no distension. There is no tenderness. There is no  rebound and no guarding.  Musculoskeletal: Normal range of motion. She exhibits no edema.  Lymphadenopathy:    She has no cervical adenopathy.  Neurological: She is alert and oriented to person, place, and time. She displays no tremor. She displays no seizure activity. Coordination normal.  Skin: No rash noted. No erythema.  Psychiatric: She has a normal mood and affect. Her behavior is normal.    ED Course  Procedures (including critical care time)   DIAGNOSTIC STUDIES: Oxygen Saturation is 99% on room air, normal by my interpretation.    COORDINATION OF CARE:  8:56 PM   Physical exam complete.  Will order CT Head and review labs.   11:17 PM  Discussed radiological and lab findings with patient.  Plan to discharge patient.  Patient agrees with plan.      LABS / RADIOLOGY:   Labs Reviewed  CBC - Abnormal; Notable for the following:    WBC 11.8 (*)    All other components within normal limits  DIFFERENTIAL - Abnormal; Notable for the following:    Neutro Abs 8.6 (*)    All other components within normal limits  URINALYSIS, ROUTINE W REFLEX MICROSCOPIC - Abnormal; Notable for the following:    Hgb urine dipstick TRACE (*)    Leukocytes, UA TRACE (*)    All other components within normal limits  BASIC METABOLIC PANEL - Abnormal; Notable for the following:    Glucose, Bld 100 (*)    All other components within normal limits  URINE MICROSCOPIC-ADD ON  POCT PREGNANCY, URINE    Results for orders placed during the hospital encounter of 06/08/11  CBC      Component Value Range   WBC 11.8 (*) 4.0 - 10.5 (K/uL)   RBC 4.22  3.87 - 5.11 (MIL/uL)   Hemoglobin 13.6  12.0 - 15.0 (g/dL)   HCT 16.1  09.6 - 04.5 (%)   MCV 94.5  78.0 - 100.0 (fL)   MCH 32.2  26.0 - 34.0 (pg)   MCHC 34.1  30.0 - 36.0 (g/dL)   RDW 40.9  81.1 - 91.4 (%)   Platelets 309  150 - 400 (K/uL)  DIFFERENTIAL      Component Value Range   Neutrophils Relative 73  43 - 77 (%)   Neutro Abs 8.6 (*) 1.7 - 7.7  (K/uL)   Lymphocytes Relative 20  12 - 46 (%)   Lymphs Abs 2.4  0.7 - 4.0 (K/uL)   Monocytes Relative 4  3 - 12 (%)   Monocytes Absolute 0.5  0.1 - 1.0 (K/uL)   Eosinophils Relative 2  0 - 5 (%)   Eosinophils Absolute 0.2  0.0 - 0.7 (K/uL)   Basophils Relative 0  0 - 1 (%)   Basophils Absolute 0.1  0.0 - 0.1 (K/uL)  URINALYSIS, ROUTINE W REFLEX MICROSCOPIC      Component Value Range   Color, Urine YELLOW  YELLOW    Appearance CLEAR  CLEAR    Specific Gravity, Urine 1.020  1.005 - 1.030    pH 5.5  5.0 - 8.0    Glucose, UA NEGATIVE  NEGATIVE (mg/dL)   Hgb urine dipstick TRACE (*) NEGATIVE    Bilirubin Urine NEGATIVE  NEGATIVE    Ketones, ur NEGATIVE  NEGATIVE (mg/dL)   Protein, ur NEGATIVE  NEGATIVE (mg/dL)   Urobilinogen, UA 0.2  0.0 - 1.0 (mg/dL)   Nitrite NEGATIVE  NEGATIVE    Leukocytes, UA TRACE (*) NEGATIVE   BASIC METABOLIC PANEL      Component Value Range   Sodium 140  135 - 145 (mEq/L)   Potassium 3.5  3.5 - 5.1 (mEq/L)   Chloride 104  96 - 112 (mEq/L)   CO2 27  19 - 32 (mEq/L)   Glucose, Bld 100 (*) 70 - 99 (mg/dL)   BUN 6  6 - 23 (mg/dL)   Creatinine, Ser 4.09  0.50 - 1.10 (mg/dL)   Calcium 9.7  8.4 - 81.1 (mg/dL)   GFR calc non Af Amer >90  >90 (mL/min)   GFR calc Af Amer >90  >90 (mL/min)  URINE MICROSCOPIC-ADD ON      Component Value Range   Squamous Epithelial / LPF RARE  RARE    WBC, UA 0-2  <3 (WBC/hpf)   RBC / HPF 0-2  <3 (RBC/hpf)   Bacteria, UA RARE  RARE    Ct Head Wo Contrast  06/08/2011  *RADIOLOGY REPORT*  Clinical Data:  Seizure activity  CT HEAD WITHOUT CONTRAST  Technique:  Contiguous axial images were obtained from the base of the skull through the vertex without contrast  Comparison:  None.  Findings:  The brain has a normal appearance without evidence for hemorrhage, acute infarction, hydrocephalus, or mass lesion.  There is no extra axial fluid collection.  The skull and paranasal sinuses are normal.  IMPRESSION: Normal CT of the head without  contrast.  Original Report Authenticated By: Judie Petit. Ruel Favors, M.D.          MDM  Anxiety,  pseudosz       MEDICATIONS GIVEN IN THE E.D. Scheduled Meds:    . sodium chloride  1,000 mL Intravenous Once   Continuous Infusions:   Orders Placed This Encounter  Procedures  . CT Head Wo Contrast  . CBC  . Differential  . Urinalysis with microscopic  . Basic metabolic panel  . Urine microscopic-add on     IMPRESSION: 1. Anxiety          The chart was scribed for me under my direct supervision.  I personally performed the history, physical, and medical decision making and all procedures in the evaluation of this patient.Kayla Lennert, MD 06/08/11 [phone]

## 2011-06-09 ENCOUNTER — Emergency Department (HOSPITAL_COMMUNITY)
Admission: EM | Admit: 2011-06-09 | Discharge: 2011-06-10 | Disposition: A | Discharge status: Home / Self Care | Payer: Self-pay | Attending: Emergency Medicine | Admitting: Emergency Medicine

## 2011-06-09 DIAGNOSIS — Z79899 Other long term (current) drug therapy: Secondary | ICD-10-CM | POA: Insufficient documentation

## 2011-06-09 DIAGNOSIS — R569 Unspecified convulsions: Secondary | ICD-10-CM | POA: Insufficient documentation

## 2011-06-09 LAB — POCT I-STAT, CHEM 8
Calcium, Ion: 1.17 mmol/L (ref 1.12–1.32)
Chloride: 108 mEq/L (ref 96–112)
HCT: 37 % (ref 36.0–46.0)
Potassium: 3.6 mEq/L (ref 3.5–5.1)
Sodium: 142 mEq/L (ref 135–145)

## 2011-06-10 LAB — POCT PREGNANCY, URINE: Preg Test, Ur: NEGATIVE

## 2011-06-11 ENCOUNTER — Emergency Department (HOSPITAL_COMMUNITY)
Admission: EM | Admit: 2011-06-11 | Discharge: 2011-06-11 | Disposition: A | Discharge status: Home / Self Care | Payer: Self-pay | Attending: Emergency Medicine | Admitting: Emergency Medicine

## 2011-06-11 DIAGNOSIS — R404 Transient alteration of awareness: Secondary | ICD-10-CM | POA: Insufficient documentation

## 2011-06-11 DIAGNOSIS — R569 Unspecified convulsions: Secondary | ICD-10-CM | POA: Insufficient documentation

## 2011-06-11 DIAGNOSIS — Z79899 Other long term (current) drug therapy: Secondary | ICD-10-CM | POA: Insufficient documentation

## 2011-06-11 DIAGNOSIS — F29 Unspecified psychosis not due to a substance or known physiological condition: Secondary | ICD-10-CM | POA: Insufficient documentation

## 2011-06-11 LAB — URINALYSIS, ROUTINE W REFLEX MICROSCOPIC
Ketones, ur: NEGATIVE mg/dL
Nitrite: NEGATIVE
Protein, ur: NEGATIVE mg/dL
pH: 7 (ref 5.0–8.0)

## 2011-06-11 LAB — RAPID URINE DRUG SCREEN, HOSP PERFORMED
Amphetamines: NOT DETECTED
Barbiturates: NOT DETECTED
Benzodiazepines: NOT DETECTED
Cocaine: NOT DETECTED
Opiates: NOT DETECTED

## 2011-06-11 LAB — POCT I-STAT, CHEM 8
BUN: 13 mg/dL (ref 6–23)
Calcium, Ion: 1.22 mmol/L (ref 1.12–1.32)
Chloride: 107 mEq/L (ref 96–112)
Creatinine, Ser: 0.7 mg/dL (ref 0.50–1.10)
HCT: 38 % (ref 36.0–46.0)

## 2011-06-11 LAB — URINE MICROSCOPIC-ADD ON

## 2011-06-12 LAB — URINE CULTURE

## 2011-07-11 ENCOUNTER — Telehealth: Payer: Self-pay | Admitting: Urgent Care

## 2011-07-11 ENCOUNTER — Ambulatory Visit: Payer: Self-pay | Admitting: Urgent Care

## 2011-07-11 NOTE — Telephone Encounter (Signed)
noted 

## 2011-07-11 NOTE — Telephone Encounter (Signed)
Pt was a no show

## 2011-09-10 ENCOUNTER — Encounter (HOSPITAL_COMMUNITY): Payer: Self-pay

## 2011-09-10 ENCOUNTER — Emergency Department (HOSPITAL_COMMUNITY): Payer: Self-pay

## 2011-09-10 ENCOUNTER — Emergency Department (HOSPITAL_COMMUNITY)
Admission: EM | Admit: 2011-09-10 | Discharge: 2011-09-10 | Disposition: A | Discharge status: Home / Self Care | Payer: Self-pay | Attending: Emergency Medicine | Admitting: Emergency Medicine

## 2011-09-10 DIAGNOSIS — F17200 Nicotine dependence, unspecified, uncomplicated: Secondary | ICD-10-CM | POA: Insufficient documentation

## 2011-09-10 DIAGNOSIS — R569 Unspecified convulsions: Secondary | ICD-10-CM | POA: Insufficient documentation

## 2011-09-10 DIAGNOSIS — F411 Generalized anxiety disorder: Secondary | ICD-10-CM | POA: Insufficient documentation

## 2011-09-10 DIAGNOSIS — F3289 Other specified depressive episodes: Secondary | ICD-10-CM | POA: Insufficient documentation

## 2011-09-10 DIAGNOSIS — N390 Urinary tract infection, site not specified: Secondary | ICD-10-CM | POA: Insufficient documentation

## 2011-09-10 DIAGNOSIS — F445 Conversion disorder with seizures or convulsions: Secondary | ICD-10-CM

## 2011-09-10 DIAGNOSIS — F329 Major depressive disorder, single episode, unspecified: Secondary | ICD-10-CM | POA: Insufficient documentation

## 2011-09-10 HISTORY — DX: Conversion disorder with seizures or convulsions: F44.5

## 2011-09-10 LAB — URINALYSIS, ROUTINE W REFLEX MICROSCOPIC
Glucose, UA: NEGATIVE mg/dL
Specific Gravity, Urine: 1.02 (ref 1.005–1.030)
pH: 7 (ref 5.0–8.0)

## 2011-09-10 LAB — URINE MICROSCOPIC-ADD ON

## 2011-09-10 LAB — CBC
MCHC: 34.9 g/dL (ref 30.0–36.0)
RDW: 12.1 % (ref 11.5–15.5)
WBC: 5.5 10*3/uL (ref 4.0–10.5)

## 2011-09-10 LAB — POCT PREGNANCY, URINE: Preg Test, Ur: NEGATIVE

## 2011-09-10 LAB — BASIC METABOLIC PANEL
GFR calc Af Amer: >90 mL/min (ref >90)
GFR calc non Af Amer: >90 mL/min (ref >90)
Potassium: 3.4 mEq/L — ABNORMAL LOW (ref 3.5–5.1)
Sodium: 140 mEq/L (ref 135–145)

## 2011-09-10 MED ORDER — LORAZEPAM 1 MG PO TABS
0.5000 mg | ORAL_TABLET | Freq: Once | ORAL | Status: AC
  Administered 2011-09-10: 0.5 mg via ORAL
  Filled 2011-09-10: qty 1

## 2011-09-10 MED ORDER — CEPHALEXIN 500 MG PO CAPS: 500.0000 mg | ORAL_CAPSULE | Freq: Four times a day (QID) | ORAL | Status: AC

## 2011-09-10 NOTE — ED Notes (Signed)
Pt presented to ED with ? Seizure activity. Pt will not answer triage questions. Looks to boyfriend to answer. When he answerers she nod her head yes.

## 2011-09-10 NOTE — ED Provider Notes (Signed)
History     CSN: [id number]  Arrival date & time 09/10/11  1647    Chief Complaint  Patient presents with  . Seizures     The history is provided by the patient and a friend. History Limited By: uncooperative.   Pt was seen at 1810.  Per pt and her significant other, c/o sudden onset and resolution of one brief episode of "full body shaking" that occurred PTA.  Pt's boyfriend states pt has hx of recurrent seizures since last summer, takes xanax, and "usually lays down before she has a seizure."  Denies incont of bowel or bladder, no injury, no fevers, no confusion, no fall.  Denies any change in pt's usual seizure pattern.     Past Medical History  Diagnosis Date  . Depression   . Anxiety   . Anemia     as child  . Pseudoseizure     Past Surgical History  Procedure Date  . Tonsillectomy   . Colonoscopy 05/16/2011    Procedure: COLONOSCOPY;  Surgeon: Arlyce Harman, MD;  Location: [facility];  Service: Endoscopy;  Laterality: N/A;  12:10    Family History  Problem Relation Age of Onset  . Colon cancer Neg Hx     History  Substance Use Topics  . Smoking status: Current Everyday Smoker -- 0.5 packs/day    Types: Cigarettes  . Smokeless tobacco: Not on file  . Alcohol Use: No    Review of Systems  Unable to perform ROS: Other    Allergies  Demerol and Lactaid  Home Medications   Current Outpatient Rx  Name Route Sig Dispense Refill  . ALPRAZOLAM 0.25 MG PO TABS Oral Take 0.25 mg by mouth as needed. Patient takes for acute anxiety not to exceed 15 tablets per month    . CITALOPRAM HYDROBROMIDE 20 MG PO TABS Oral Take 20 mg by mouth at bedtime.    . IBUPROFEN 200 MG PO TABS Oral Take 400 mg by mouth every 6 (six) hours as needed. For pain     . OVER THE COUNTER MEDICATION Oral Take 1 capsule by mouth daily. DIGESTIVE ADVANTAGE: Lactose Defense Formula       BP 92/56  Pulse 67  Temp(Src) 98.4 F (36.9 C) (Oral)  Resp 20  Ht 5\' 4"  (1.626 m)  Wt 110 lb  (49.896 kg)  BMI 18.88 kg/m2  SpO2 100%  Physical Exam 1815: Physical examination:  Nursing notes reviewed; Vital signs and O2 SAT reviewed;  Constitutional: Well developed, Well nourished, Well hydrated, In no acute distress; Head:  Normocephalic, atraumatic; Eyes: EOMI, PERRL, No scleral icterus; ENMT: Mouth and pharynx normal, Mucous membranes moist; Neck: Supple, Full range of motion, No lymphadenopathy; Cardiovascular: Regular rate and rhythm, No murmur, rub, or gallop; Respiratory: Breath sounds clear & equal bilaterally, No rales, rhonchi, wheezes, or rub, Normal respiratory effort/excursion; Chest: Nontender, Movement normal; Abdomen: Soft, Nontender, Nondistended, Normal bowel sounds; Genitourinary: No CVA tenderness; Extremities: Pulses normal, No tenderness, No edema, No calf edema or asymmetry.; Neuro: Awake/alert, eyes open, speaking in a whisper, No facial droop, speech clear, moves all ext well without apparent gross focal motor deficits.; Skin: Color normal, Warm, Dry, no rash, no petechiae.;  Psych:  Affect flat, poor eye contact.    ED Course  Procedures  1820:  Pt initially held her lips closed when I spoke with her.  I asked her to speak and she shook her head "no."  I insisted she needed to speak to  me, as it was a part of my physical exam and assessment of her to assure proper ED care.  Pt then began to mouth words, then speak full sentences while stuttering every word, then speak in a whisper to me.  Pt often looked to her boyfriend to speak to me, I requested that he did not, and that I needed her to tell me her symptoms and what happened to bring her into the ED.  Pt began then to speak to me in a louder voice, after I told her that I could not hear her whispering to me.  She states she has had "more than 100 seizures" since last July, often having "multiple back-to-back seizures every day" since that time.  Describes a MRI and EEG that was performed at Neuro Dr. Oliva Bustard  office, with her last visit to Neuro MD in December.  States the only medication she was given to "stop my seizures" was xanax.    1900:  T/C to [facility] Dr. Donia Pounds, case discussed, including:  HPI, pertinent PM/SHx, VS/PE, dx testing, ED course and treatment.  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   MDM  MDM Reviewed: nursing note, vitals and previous chart Interpretation: labs, x-ray and CT scan     Results for orders placed during the hospital encounter of 09/10/11  URINALYSIS, ROUTINE W REFLEX MICROSCOPIC      Component Value Range   Color, Urine YELLOW  YELLOW    APPearance CLEAR  CLEAR    Specific Gravity, Urine 1.020  1.005 - 1.030    pH 7.0  5.0 - 8.0    Glucose, UA NEGATIVE  NEGATIVE (mg/dL)   Hgb urine dipstick TRACE (*) NEGATIVE    Bilirubin Urine NEGATIVE  NEGATIVE    Ketones, ur NEGATIVE  NEGATIVE (mg/dL)   Protein, ur NEGATIVE  NEGATIVE (mg/dL)   Urobilinogen, UA 0.2  0.0 - 1.0 (mg/dL)   Nitrite NEGATIVE  NEGATIVE    Leukocytes, UA NEGATIVE  NEGATIVE   CBC      Component Value Range   WBC 5.5  4.0 - 10.5 (K/uL)   RBC 3.82 (*) 3.87 - 5.11 (MIL/uL)   Hemoglobin 12.4  12.0 - 15.0 (g/dL)   HCT 41.3 (*) 24.4 - 46.0 (%)   MCV 92.9  78.0 - 100.0 (fL)   MCH 32.5  26.0 - 34.0 (pg)   MCHC 34.9  30.0 - 36.0 (g/dL)   RDW 01.0  27.2 - 53.6 (%)   Platelets 233  150 - 400 (K/uL)  BASIC METABOLIC PANEL      Component Value Range   Sodium 140  135 - 145 (mEq/L)   Potassium 3.4 (*) 3.5 - 5.1 (mEq/L)   Chloride 105  96 - 112 (mEq/L)   CO2 27  19 - 32 (mEq/L)   Glucose, Bld 88  70 - 99 (mg/dL)   BUN 8  6 - 23 (mg/dL)   Creatinine, Ser 6.44  0.50 - 1.10 (mg/dL)   Calcium 9.8  8.4 - 03.4 (mg/dL)   GFR calc non Af Amer >90  >90 (mL/min)   GFR calc Af Amer >90  >90 (mL/min)  POCT  PREGNANCY, URINE      Component Value Range   Preg Test, Ur NEGATIVE    URINE MICROSCOPIC-ADD ON      Component Value Range  Squamous Epithelial / LPF FEW (*) RARE    WBC, UA 3-6  <3 (WBC/hpf)   RBC / HPF 3-6  <3 (RBC/hpf)   Bacteria, UA MANY (*) RARE    Dg Chest 2 View 09/10/2011  *RADIOLOGY REPORT*  Clinical Data: Seizure.  CHEST - 2 VIEW  Comparison: None.  Findings: Lungs are clear.  Heart size is normal.  No pneumothorax or effusion.  No focal bony abnormality.  IMPRESSION: Negative chest.  Original Report Authenticated By: Bernadene Bell. Maricela Curet, M.D.   Ct Head Wo Contrast 09/10/2011  *RADIOLOGY REPORT*  Clinical Data: Status post seizure.  CT HEAD WITHOUT CONTRAST  Technique:  Contiguous axial images were obtained from the base of the skull through the vertex without contrast.  Comparison: Head CT scan 06/08/2011.  Findings: The brain appears normal without evidence of acute infarction, hemorrhage, mass lesion, mass effect, midline shift or abnormal extra-axial fluid collection.  No hydrocephalus or pneumocephalus.  The calvarium is intact.  IMPRESSION: Normal exam.  Original Report Authenticated By: Bernadene Bell. D'ALESSIO, M.D.     10:11 PM:  Pt has ambulated around the ED without distress, gait steady and upright, resps easy.  Requested food from ED RN.  Has tol PO well in ED.  Will tx for UTI.  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  EPIC chart review reveals pt has been eval in ED previously for same.  Will have pt continue her xanax and f/u with Neuro MD this week.  Dx testing d/w pt and friend.  Questions answered.  Verb understanding, agreeable to d/c home with outpt f/u.      Keshona Kartes Allison Quarry, DO 09/12/11 0007

## 2011-09-10 NOTE — ED Notes (Signed)
Per pt's boyfriend pt received Depo shot for birthcontrol in July and had first seizure.  Pt was taking Paxil but is currently taking citalopram instead.  Per pt boyfriend, pt had MRI done by neurologist in [place], [place].  Per pt boyfriend, pt's first seizure was in July and she has been seen approx five times for seizures.

## 2011-10-16 ENCOUNTER — Encounter (HOSPITAL_COMMUNITY): Payer: Self-pay | Admitting: *Deleted

## 2011-10-16 ENCOUNTER — Emergency Department (HOSPITAL_COMMUNITY): Admission: EM | Admit: 2011-10-16 | Discharge: 2011-10-16 | Discharge status: Against Medical Advice | Payer: Self-pay

## 2011-10-16 NOTE — ED Notes (Signed)
Pt informed registration that she could not wait any longer, registration asked pt to sign AMA form which pt did not but did print her name on form

## 2011-10-16 NOTE — ED Notes (Signed)
Last depo shot since July of last year due to seizures, first menses 2/16, had PAP smear on 2/21, no results yet; vaginal discharge -mucus like red per pt., burning on urination, lower abd pain-cramps, hurts worse after urination

## 2011-11-23 ENCOUNTER — Encounter (HOSPITAL_COMMUNITY): Payer: Self-pay | Admitting: *Deleted

## 2011-11-23 ENCOUNTER — Emergency Department (HOSPITAL_COMMUNITY)
Admission: EM | Admit: 2011-11-23 | Discharge: 2011-11-23 | Disposition: A | Discharge status: Home / Self Care | Payer: Self-pay | Attending: Emergency Medicine | Admitting: Emergency Medicine

## 2011-11-23 DIAGNOSIS — R059 Cough, unspecified: Secondary | ICD-10-CM | POA: Insufficient documentation

## 2011-11-23 DIAGNOSIS — R070 Pain in throat: Secondary | ICD-10-CM | POA: Insufficient documentation

## 2011-11-23 DIAGNOSIS — F3289 Other specified depressive episodes: Secondary | ICD-10-CM | POA: Insufficient documentation

## 2011-11-23 DIAGNOSIS — F329 Major depressive disorder, single episode, unspecified: Secondary | ICD-10-CM | POA: Insufficient documentation

## 2011-11-23 DIAGNOSIS — R05 Cough: Secondary | ICD-10-CM

## 2011-11-23 DIAGNOSIS — R6883 Chills (without fever): Secondary | ICD-10-CM | POA: Insufficient documentation

## 2011-11-23 DIAGNOSIS — F411 Generalized anxiety disorder: Secondary | ICD-10-CM | POA: Insufficient documentation

## 2011-11-23 DIAGNOSIS — J3489 Other specified disorders of nose and nasal sinuses: Secondary | ICD-10-CM | POA: Insufficient documentation

## 2011-11-23 DIAGNOSIS — F17200 Nicotine dependence, unspecified, uncomplicated: Secondary | ICD-10-CM | POA: Insufficient documentation

## 2011-11-23 MED ORDER — AMOXICILLIN 500 MG PO CAPS: 500.0000 mg | ORAL_CAPSULE | Freq: Three times a day (TID) | ORAL | Status: AC

## 2011-11-23 MED ORDER — GUAIFENESIN-CODEINE 100-10 MG/5ML PO SYRP: 10.0000 mL | ORAL_SOLUTION | Freq: Three times a day (TID) | ORAL | Status: AC | PRN

## 2011-11-23 NOTE — Discharge Instructions (Signed)
Cough, Adult  A cough is a reflex. It helps you clear your throat and airways. A cough can help heal your body. A cough can last 2 or 3 weeks (acute) or may last more than 8 weeks (chronic). Some common causes of a cough can include an infection, allergy, or a cold. HOME CARE  Only take medicine as told by your doctor.   If given, take your medicines (antibiotics) as told. Finish them even if you start to feel better.   Use a cold steam vaporizer or humidier in your home. This can help loosen thick spit (secretions).   Sleep so you are almost sitting up (semi-upright). Use pillows to do this. This helps reduce coughing.   Rest as needed.   Stop smoking if you smoke.  GET HELP RIGHT AWAY IF:  You have yellowish-white fluid (pus) in your thick spit.   Your cough gets worse.   Your medicine does not reduce coughing, and you are losing sleep.   You cough up blood.   You have trouble breathing.   Your pain gets worse and medicine does not help.   You have a fever.  MAKE SURE YOU:   Understand these instructions.   Will watch your condition.   Will get help right away if you are not doing well or get worse.  Document Released: 04/20/2011 Document Revised: 07/27/2011 Document Reviewed: 04/20/2011 [employer] Patient Information 2012 [employer]. 

## 2011-11-23 NOTE — ED Notes (Signed)
Onset yesterday, headache, congestion, , Started sore throat today.  Cough, yellow sputum.

## 2011-11-28 NOTE — ED Provider Notes (Signed)
History     CSN: [id number]  Arrival date & time 11/23/11  1323   First MD Initiated Contact with Patient 11/23/11 1343      Chief Complaint  Patient presents with  . Influenza    (Consider location/radiation/quality/duration/timing/severity/associated sxs/prior treatment) Patient is a 21 y.o. female presenting with cough. The history is provided by the patient.  Cough This is a new problem. The current episode started yesterday. The problem occurs every few minutes. The problem has not changed since onset.The cough is productive of purulent sputum. There has been no fever. Associated symptoms include chills, rhinorrhea and sore throat. Pertinent negatives include no chest pain, no sweats, no ear congestion, no ear pain, no headaches, no myalgias, no shortness of breath and no wheezing. Associated symptoms comments: Nasal congestion. She has tried nothing for the symptoms. The treatment provided no relief. She is a smoker. Her past medical history is significant for bronchitis. Her past medical history does not include COPD or asthma.    Past Medical History  Diagnosis Date  . Depression   . Anxiety   . Anemia     as child  . Pseudoseizure     Past Surgical History  Procedure Date  . Tonsillectomy   . Colonoscopy 05/16/2011    Procedure: COLONOSCOPY;  Surgeon: Arlyce Harman, MD;  Location: [facility];  Service: Endoscopy;  Laterality: N/A;  12:10    Family History  Problem Relation Age of Onset  . Colon cancer Neg Hx     History  Substance Use Topics  . Smoking status: Current Everyday Smoker -- 0.5 packs/day    Types: Cigarettes  . Smokeless tobacco: Not on file  . Alcohol Use: No    OB History    Grav Para Term Preterm Abortions TAB SAB Ect Mult Living                  Review of Systems  Constitutional: Positive for chills. Negative for fever, activity change, appetite change and fatigue.  HENT: Positive for congestion, sore throat, rhinorrhea and sinus  pressure. Negative for ear pain, trouble swallowing, neck pain and neck stiffness.   Respiratory: Positive for cough. Negative for chest tightness, shortness of breath and wheezing.   Cardiovascular: Negative for chest pain and palpitations.  Gastrointestinal: Negative for nausea, vomiting, abdominal pain and blood in stool.  Genitourinary: Negative for dysuria, hematuria and flank pain.  Musculoskeletal: Negative for myalgias, back pain and arthralgias.  Skin: Negative for rash.  Neurological: Negative for dizziness, weakness, numbness and headaches.  Hematological: Does not bruise/bleed easily.    Allergies  Demerol and Lactaid  Home Medications   Current Outpatient Rx  Name Route Sig Dispense Refill  . ALPRAZOLAM 0.25 MG PO TABS Oral Take 0.25 mg by mouth as needed. Patient takes for acute anxiety not to exceed 15 tablets per month    . AMOXICILLIN 500 MG PO CAPS Oral Take 1 capsule (500 mg total) by mouth 3 (three) times daily. 30 capsule 0  . CITALOPRAM HYDROBROMIDE 20 MG PO TABS Oral Take 20 mg by mouth at bedtime.    . GUAIFENESIN-CODEINE 100-10 MG/5ML PO SYRP Oral Take 10 mLs by mouth 3 (three) times daily as needed for cough. 120 mL 0  . IBUPROFEN 200 MG PO TABS Oral Take 400 mg by mouth every 6 (six) hours as needed. For pain     . OVER THE COUNTER MEDICATION Oral Take 1 capsule by mouth daily. DIGESTIVE ADVANTAGE: Lactose Defense  Formula       BP 131/69  Pulse 65  Temp(Src) 97.7 F (36.5 C) (Oral)  Resp 18  Ht 5\' 5"  (1.651 m)  Wt 115 lb (52.164 kg)  BMI 19.14 kg/m2  SpO2 100%  LMP 11/06/2011  Physical Exam  Nursing note and vitals reviewed. Constitutional: She is oriented to person, place, and time. She appears well-developed and well-nourished. No distress.  HENT:  Head: Normocephalic and atraumatic.  Mouth/Throat: Oropharynx is clear and moist.  Eyes: EOM are normal. Pupils are equal, round, and reactive to light. Right eye exhibits no discharge. Left eye  exhibits no discharge.  Neck: Normal range of motion. Neck supple.  Cardiovascular: Normal rate, regular rhythm and normal heart sounds.   Pulmonary/Chest: Effort normal. No respiratory distress.       Coarse lung sounds bilaterally.  No rales or wheezing.  Musculoskeletal: Normal range of motion. She exhibits no edema.  Lymphadenopathy:    She has no cervical adenopathy.  Neurological: She is alert and oriented to person, place, and time. She exhibits normal muscle tone. Coordination normal.  Skin: Skin is warm and dry.    ED Course  Procedures (including critical care time)    1. Cough       MDM     Patient is alert, non-toxic appearing.  Ambulates well.  Mucous membranes are moist.  Likely bronchitis.  No tachycardia, tachypnea or hypoxia.  Doubt PE.    Patient / Family / Caregiver understand and agree with initial ED impression and plan with expectations set for ED visit. Pt stable in ED with no significant deterioration in condition. Pt feels improved after observation and/or treatment in ED.       Trask Vosler L. Rylyn Ranganathan, [place] 11/28/11 1459

## 2011-12-01 NOTE — ED Provider Notes (Signed)
Medical screening examination/treatment/procedure(s) were performed by non-physician practitioner and as supervising physician I was immediately available for consultation/collaboration.   Othell Jaime L Izadora Roehr, MD 12/01/11 1816 

## 2011-12-17 ENCOUNTER — Emergency Department (HOSPITAL_COMMUNITY)
Admission: EM | Admit: 2011-12-17 | Discharge: 2011-12-17 | Disposition: A | Discharge status: Home / Self Care | Payer: Self-pay | Attending: Emergency Medicine | Admitting: Emergency Medicine

## 2011-12-17 ENCOUNTER — Encounter (HOSPITAL_COMMUNITY): Payer: Self-pay | Admitting: *Deleted

## 2011-12-17 ENCOUNTER — Emergency Department (HOSPITAL_COMMUNITY): Payer: Self-pay

## 2011-12-17 DIAGNOSIS — Y92009 Unspecified place in unspecified non-institutional (private) residence as the place of occurrence of the external cause: Secondary | ICD-10-CM | POA: Insufficient documentation

## 2011-12-17 DIAGNOSIS — Y93H2 Activity, gardening and landscaping: Secondary | ICD-10-CM | POA: Insufficient documentation

## 2011-12-17 DIAGNOSIS — S56911A Strain of unspecified muscles, fascia and tendons at forearm level, right arm, initial encounter: Secondary | ICD-10-CM

## 2011-12-17 DIAGNOSIS — R209 Unspecified disturbances of skin sensation: Secondary | ICD-10-CM | POA: Insufficient documentation

## 2011-12-17 DIAGNOSIS — X58XXXA Exposure to other specified factors, initial encounter: Secondary | ICD-10-CM | POA: Insufficient documentation

## 2011-12-17 DIAGNOSIS — IMO0002 Reserved for concepts with insufficient information to code with codable children: Secondary | ICD-10-CM | POA: Insufficient documentation

## 2011-12-17 DIAGNOSIS — F341 Dysthymic disorder: Secondary | ICD-10-CM | POA: Insufficient documentation

## 2011-12-17 NOTE — ED Notes (Signed)
Pt a/ox4. Resp even and unlabored. NAD at this time. D/C instructions reviewed with pt. Pt verbalized understanding. Pt ambulated to lobby with steady gate.  

## 2011-12-17 NOTE — ED Notes (Signed)
Pt reports she injured her rt arm today using her lawn mower

## 2011-12-17 NOTE — Discharge Instructions (Signed)
Cryotherapy Cryotherapy means treatment with cold. Ice or gel packs can be used to reduce both pain and swelling. Ice is the most helpful within the first 24 to 48 hours after an injury or flareup from overusing a muscle or joint. Sprains, strains, spasms, burning pain, shooting pain, and aches can all be eased with ice. Ice can also be used when recovering from surgery. Ice is effective, has very few side effects, and is safe for most people to use. PRECAUTIONS  Ice is not a safe treatment option for people with:  Raynaud's phenomenon. This is a condition affecting small blood vessels in the extremities. Exposure to cold may cause your problems to return.   Cold hypersensitivity. There are many forms of cold hypersensitivity, including:   Cold urticaria. Red, itchy hives appear on the skin when the tissues begin to warm after being iced.   Cold erythema. This is a red, itchy rash caused by exposure to cold.   Cold hemoglobinuria. Red blood cells break down when the tissues begin to warm after being iced. The hemoglobin that carry oxygen are passed into the urine because they cannot combine with blood proteins fast enough.   Numbness or altered sensitivity in the area being iced.  If you have any of the following conditions, do not use ice until you have discussed cryotherapy with your caregiver:  Heart conditions, such as arrhythmia, angina, or chronic heart disease.   High blood pressure.   Healing wounds or open skin in the area being iced.   Current infections.   Rheumatoid arthritis.   Poor circulation.   Diabetes.  Ice slows the blood flow in the region it is applied. This is beneficial when trying to stop inflamed tissues from spreading irritating chemicals to surrounding tissues. However, if you expose your skin to cold temperatures for too long or without the proper protection, you can damage your skin or nerves. Watch for signs of skin damage due to cold. HOME CARE  INSTRUCTIONS Follow these tips to use ice and cold packs safely.  Place a dry or damp towel between the ice and skin. A damp towel will cool the skin more quickly, so you may need to shorten the time that the ice is used.   For a more rapid response, add gentle compression to the ice.   Ice for no more than 10 to 20 minutes at a time. The bonier the area you are icing, the less time it will take to get the benefits of ice.   Check your skin after 5 minutes to make sure there are no signs of a poor response to cold or skin damage.   Rest 20 minutes or more in between uses.   Once your skin is numb, you can end your treatment. You can test numbness by very lightly touching your skin. The touch should be so light that you do not see the skin dimple from the pressure of your fingertip. When using ice, most people will feel these normal sensations in this order: cold, burning, aching, and numbness.   Do not use ice on someone who cannot communicate their responses to pain, such as small children or people with dementia.  HOW TO MAKE AN ICE PACK Ice packs are the most common way to use ice therapy. Other methods include ice massage, ice baths, and cryo-sprays. Muscle creams that cause a cold, tingly feeling do not offer the same benefits that ice offers and should not be used as a substitute  unless recommended by your caregiver. To make an ice pack, do one of the following:  Place crushed ice or a bag of frozen vegetables in a sealable plastic bag. Squeeze out the excess air. Place this bag inside another plastic bag. Slide the bag into a pillowcase or place a damp towel between your skin and the bag.   Mix 3 parts water with 1 part rubbing alcohol. Freeze the mixture in a sealable plastic bag. When you remove the mixture from the freezer, it will be slushy. Squeeze out the excess air. Place this bag inside another plastic bag. Slide the bag into a pillowcase or place a damp towel between your skin  and the bag.  SEEK MEDICAL CARE IF:  You develop white spots on your skin. This may give the skin a blotchy (mottled) appearance.   Your skin turns blue or pale.   Your skin becomes waxy or hard.   Your swelling gets worse.  MAKE SURE YOU:   Understand these instructions.   Will watch your condition.   Will get help right away if you are not doing well or get worse.  Document Released: 04/03/2011 Document Revised: 07/27/2011 Document Reviewed: 04/03/2011 [facility] Patient Information 2012 [place], [place].Muscle Strain A muscle strain, or pulled muscle, occurs when a muscle is over-stretched. A small number of muscle fibers may also be torn. This is especially common in athletes. This happens when a sudden violent force placed on a muscle pushes it past its capacity. Usually, recovery from a pulled muscle takes 1 to 2 weeks. But complete healing will take 5 to 6 weeks. There are millions of muscle fibers. Following injury, your body will usually return to normal quickly. HOME CARE INSTRUCTIONS   While awake, apply ice to the sore muscle for 15 to 20 minutes each hour for the first 2 days. Put ice in a plastic bag and place a towel between the bag of ice and your skin.   Do not use the pulled muscle for several days. Do not use the muscle if you have pain.   You may wrap the injured area with an elastic bandage for comfort. Be careful not to bind it too tightly. This may interfere with blood circulation.   Only take over-the-counter or prescription medicines for pain, discomfort, or fever as directed by your caregiver. Do not use aspirin as this will increase bleeding (bruising) at injury site.   Warming up before exercise helps prevent muscle strains.  SEEK MEDICAL CARE IF:  There is increased pain or swelling in the affected area. MAKE SURE YOU:   Understand these instructions.   Will watch your condition.   Will get help right away if you are not doing well or get worse.    Document Released: 08/07/2005 Document Revised: 07/27/2011 Document Reviewed: 03/06/2007 [facility] Patient Information 2012 [place], [place].   Take ibuprofen 500 mg every 8 hrs with food.  Wear the wrist splint for stability and comfort.  Apply ice several times daily.  Follow up with your MD or dr. Romeo Apple.

## 2011-12-17 NOTE — ED Provider Notes (Signed)
History     CSN: [id number]  Arrival date & time 12/17/11  2048   None     Chief Complaint  Patient presents with  . Arm Injury    (Consider location/radiation/quality/duration/timing/severity/associated sxs/prior treatment) HPI Comments: Pt was mowing lawn with a riding mower.  It got stuck in a ditch.  She tried pushing it out and injured R forearm.    Mild R hand numbness.  R hand dominant.  Patient is a 21 y.o. female presenting with arm injury. The history is provided by the patient. No language interpreter was used.  Arm Injury  Episode onset: 3 days ago. The incident occurred at home. The wounds were not self-inflicted. There is an injury to the right forearm. The pain is moderate. Associated symptoms include numbness. Pertinent negatives include no weakness. There have been no prior injuries to these areas. She is right-handed. Her tetanus status is UTD. She has been behaving normally.    Past Medical History  Diagnosis Date  . Depression   . Anxiety   . Anemia     as child  . Pseudoseizure     Past Surgical History  Procedure Date  . Tonsillectomy   . Colonoscopy 05/16/2011    Procedure: COLONOSCOPY;  Surgeon: Arlyce Harman, MD;  Location: [facility];  Service: Endoscopy;  Laterality: N/A;  12:10    Family History  Problem Relation Age of Onset  . Colon cancer Neg Hx     History  Substance Use Topics  . Smoking status: Current Everyday Smoker -- 0.5 packs/day    Types: Cigarettes  . Smokeless tobacco: Not on file  . Alcohol Use: No    OB History    Grav Para Term Preterm Abortions TAB SAB Ect Mult Living                  Review of Systems  Musculoskeletal:       Forearm injury   Neurological: Positive for numbness. Negative for weakness.  All other systems reviewed and are negative.    Allergies  Demerol and Lactaid  Home Medications   Current Outpatient Rx  Name Route Sig Dispense Refill  . ALPRAZOLAM 0.25 MG PO TABS Oral Take 0.25  mg by mouth as needed. Patient takes for acute anxiety not to exceed 15 tablets per month    . CITALOPRAM HYDROBROMIDE 20 MG PO TABS Oral Take 20 mg by mouth at bedtime.    . IBUPROFEN 200 MG PO TABS Oral Take 400 mg by mouth every 6 (six) hours as needed. For pain     . OVER THE COUNTER MEDICATION Oral Take 1 capsule by mouth daily. DIGESTIVE ADVANTAGE: Lactose Defense Formula       BP 104/55  Pulse 59  Temp(Src) 97.9 F (36.6 C) (Oral)  Resp 20  Ht 5\' 5"  (1.651 m)  Wt 120 lb (54.432 kg)  BMI 19.97 kg/m2  SpO2 99%  LMP 11/06/2011  Physical Exam  Nursing note and vitals reviewed. Constitutional: She is oriented to person, place, and time. She appears well-developed and well-nourished. No distress.  HENT:  Head: Normocephalic and atraumatic.  Eyes: EOM are normal.  Neck: Normal range of motion.  Cardiovascular: Normal rate, regular rhythm and normal heart sounds.   Pulmonary/Chest: Effort normal and breath sounds normal.  Abdominal: Soft. She exhibits no distension. There is no tenderness.  Musculoskeletal: She exhibits tenderness.       Arms:      Pain with palpation to  R mid forearm.  Pain with hand and wrist extension and flexion c/w muscle strain.  No visible swelling or ecchymosis.  Skin intact.  Mild diffuse numbness to hand .  Neurological: She is alert and oriented to person, place, and time.  Skin: Skin is warm and dry.  Psychiatric: She has a normal mood and affect. Judgment normal.    ED Course  Procedures (including critical care time)  Labs Reviewed - No data to display Dg Forearm Right  12/17/2011  *RADIOLOGY REPORT*  Clinical Data: Right arm numbness and swelling.  RIGHT FOREARM - 2 VIEW  Comparison: None.  Findings: No fracture of the right forearm identified. If there is concern for wrist or elbow pathology, dedicated views of that location would be recommended.  No aggressive osseous lesion.  No radiopaque foreign body.  IMPRESSION: No acute osseous  abnormality identified of the right forearm.  Original Report Authenticated By: Waneta Martins, M.D.     1. Strain of right forearm       MDM  Wrist splint, ice , ibuprofen.  F/u with PCP or dr. Romeo Apple as needed.        Worthy Rancher, PA 12/17/11 2232  Worthy Rancher, PA 12/22/11 [phone]

## 2011-12-23 NOTE — ED Provider Notes (Signed)
Medical screening examination/treatment/procedure(s) were performed by non-physician practitioner and as supervising physician I was immediately available for consultation/collaboration.  Indie Nickerson S. Kayzlee Wirtanen, MD 12/23/11 2318 

## 2011-12-27 ENCOUNTER — Emergency Department (HOSPITAL_COMMUNITY)
Admission: EM | Admit: 2011-12-27 | Discharge: 2011-12-27 | Disposition: A | Discharge status: Home / Self Care | Payer: Self-pay | Attending: Emergency Medicine | Admitting: Emergency Medicine

## 2011-12-27 ENCOUNTER — Encounter (HOSPITAL_COMMUNITY): Payer: Self-pay | Admitting: *Deleted

## 2011-12-27 DIAGNOSIS — R569 Unspecified convulsions: Secondary | ICD-10-CM | POA: Insufficient documentation

## 2011-12-27 DIAGNOSIS — F17200 Nicotine dependence, unspecified, uncomplicated: Secondary | ICD-10-CM | POA: Insufficient documentation

## 2011-12-27 DIAGNOSIS — F411 Generalized anxiety disorder: Secondary | ICD-10-CM | POA: Insufficient documentation

## 2011-12-27 NOTE — ED Notes (Addendum)
Pt states that she has been evaluated at [facility] before for seizures; states cause is unknown; no seizure activity noted at this time

## 2011-12-27 NOTE — ED Notes (Signed)
Pt states she had 5 seizures prior to arrival of EMS; Pt has hx of seizures; Pt now complaining of headache

## 2011-12-27 NOTE — Discharge Instructions (Signed)
Please be aware you may have another seizure ° °Do not drive until seen by your physician for your condition ° °Do not climb ladders/roofs/trees as a seizure can occur at that height and cause serious harm ° °Do not bathe/swim alone as a seizure can occur and cause serious harm ° °Please followup with your physician or neurologist for further testing and possible treatment ° ° °

## 2011-12-27 NOTE — ED Provider Notes (Signed)
History     CSN: [id number]  Arrival date & time 12/27/11  1820   First MD Initiated Contact with Patient 12/27/11 1845      Chief Complaint  Patient presents with  . Seizures     Patient is a 21 y.o. female presenting with seizures. The history is provided by the patient and a friend.  Seizures  This is a recurrent problem. The current episode started 1 to 2 hours ago. The problem has been rapidly improving. There were 4 to 5 seizures. The most recent episode lasted more than 5 minutes. Pertinent negatives include no neck stiffness and no vomiting. Characteristics include loss of consciousness. Characteristics do not include bladder incontinence or bit tongue. The episode was witnessed. The seizure(s) had no focality. There has been no fever. There were no medications administered prior to arrival.  Pt presents for seizure Friend at bedside reports patient had "5 seizures" tonight - they terminated spontaneously She reports each seizure lasted for one minute then would stop and reported this continued for "like 30 minutes" She is not at baseline No meds needed to stop seizure No trauma/falls reported Pt reports mild headache at this time, but no other complaints. 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 Depression   . Anxiety   . Anemia     as child  . Pseudoseizure     Past Surgical History  Procedure Date  . Tonsillectomy   . Colonoscopy 05/16/2011    Procedure: COLONOSCOPY;  Surgeon: Arlyce Harman, MD;  Location: [facility];  Service: Endoscopy;  Laterality: N/A;  12:10     Family History  Problem Relation Age of Onset  . Colon cancer Neg Hx     History  Substance Use Topics  . Smoking status: Current Everyday Smoker -- 0.5 packs/day    Types: Cigarettes  . Smokeless tobacco: Not on file  . Alcohol Use: Not on file    OB History    Grav Para Term Preterm Abortions TAB SAB Ect Mult Living                  Review of Systems  Gastrointestinal: Negative for vomiting.  Genitourinary: Negative for bladder incontinence.  Neurological: Positive for seizures and loss of consciousness.  All other systems reviewed and are negative.    Allergies  Demerol and Lactaid  Home Medications   Current Outpatient Rx  Name Route Sig Dispense Refill  . ALPRAZOLAM 0.25 MG PO TABS Oral Take 0.25 mg by mouth as needed. Patient takes for acute anxiety not to exceed 15 tablets per month    . CITALOPRAM HYDROBROMIDE 20 MG PO TABS Oral Take 20 mg by mouth at bedtime.    . IBUPROFEN 200 MG PO TABS Oral Take 400 mg by mouth every 6 (six) hours as needed. For pain     . OVER THE COUNTER MEDICATION Oral Take 1 capsule by mouth daily. DIGESTIVE ADVANTAGE: Lactose Defense Formula       BP 121/70  Pulse 84  Temp 98.5 F (36.9 C)  Resp 20  Ht 5\' 4"  (1.626 m)  Wt 120  lb (54.432 kg)  BMI 20.60 kg/m2  SpO2 98%  LMP 12/18/2011  Physical Exam CONSTITUTIONAL: Well developed/well nourished HEAD AND FACE: Normocephalic/atraumatic EYES: EOMI/PERRL ENMT: Mucous membranes moist, no tongue lacerations noted NECK: supple no meningeal signs SPINE:entire spine nontender CV S1/S2 noted, no murmurs/rubs/gallops noted LUNGS: Lungs are clear to auscultation bilaterally, no apparent distress ABDOMEN: soft, nontender, no rebound or guarding GU:no cva tenderness NEURO: Pt is awake/alert, moves all extremitiesx4.  Pt smiling throughout exam, appropriate, nontoxic No arm/leg drift No facial droop is noted.   EXTREMITIES: pulses normal, full ROM SKIN: warm, color  normal PSYCH: no abnormalities of mood noted  ED Course  Procedures    Labs Reviewed  POCT PREGNANCY, URINE  GLUCOSE, CAPILLARY  URINALYSIS, ROUTINE W REFLEX MICROSCOPIC     1. Seizure    Pt well appearing no distress on initial exam I offered to monitor in the ED for several hours give one dose of ativan.  Pt refuses ativan.   She requests "to figure out" what causes seizures.  I explained to her that in the ER, we r/o life threatening causes of seizures and if seizures continue we can admit to hospital.  She has already had negative CT head previously.  She has not been on antiepileptic therapy.  She was well appearing, no distress, initially did not appear to need admission.  When I informed her that given she has seen two separate specialists, it is unlikely we could exactly determine cause in the ED as it is beyond the scope of emergency department capabilities and she could f/u safely as outpatient.  She became angry, refused any observation period and demanded to be discharged.     MDM  Nursing notes reviewed and considered in documentation All labs/vitals reviewed and considered Previous records reviewed and considered         Joya Gaskins, MD 12/27/11 2025

## 2012-02-21 ENCOUNTER — Emergency Department (HOSPITAL_COMMUNITY): Payer: Self-pay

## 2012-02-21 ENCOUNTER — Emergency Department (HOSPITAL_COMMUNITY)
Admission: EM | Admit: 2012-02-21 | Discharge: 2012-02-21 | Disposition: A | Discharge status: Home / Self Care | Payer: Self-pay | Attending: Emergency Medicine | Admitting: Emergency Medicine

## 2012-02-21 ENCOUNTER — Encounter (HOSPITAL_COMMUNITY): Payer: Self-pay | Admitting: *Deleted

## 2012-02-21 DIAGNOSIS — M25539 Pain in unspecified wrist: Secondary | ICD-10-CM | POA: Insufficient documentation

## 2012-02-21 DIAGNOSIS — M778 Other enthesopathies, not elsewhere classified: Secondary | ICD-10-CM

## 2012-02-21 DIAGNOSIS — F17200 Nicotine dependence, unspecified, uncomplicated: Secondary | ICD-10-CM | POA: Insufficient documentation

## 2012-02-21 MED ORDER — NAPROXEN 500 MG PO TABS: 500.0000 mg | ORAL_TABLET | Freq: Two times a day (BID) | ORAL | Status: DC

## 2012-02-21 MED ORDER — HYDROCODONE-ACETAMINOPHEN 5-325 MG PO TABS: ORAL_TABLET | ORAL | Status: AC

## 2012-02-21 NOTE — ED Provider Notes (Signed)
History     CSN: [id number]  Arrival date & time 02/21/12  1545   First MD Initiated Contact with Patient 02/21/12 1609      Chief Complaint  Patient presents with  . Wrist Pain    (Consider location/radiation/quality/duration/timing/severity/associated sxs/prior treatment) HPI Comments: Patient complains of pain and swelling to her right wrist for 2 days.  Pain is worse with movement. She also complains of intermittent" tingling and numbness" to her hand and fingertips. She denies recent injury fall. She does state that she uses her hands rather frequently she is right-hand dominant. She denies elbow, shoulder or neck pain.  Patient is a 21 y.o. female presenting with wrist pain. The history is provided by the patient.  Wrist Pain This is a new problem. The current episode started in the past 7 days. The problem occurs constantly. The problem has been gradually worsening. Associated symptoms include arthralgias, joint swelling and numbness. Pertinent negatives include no chest pain, chills, fever, headaches, nausea, neck pain, rash, swollen glands, vomiting or weakness. Associated symptoms comments: "tingling" to her hand and fingertips.. The symptoms are aggravated by bending (Movement and palpation). She has tried NSAIDs for the symptoms. The treatment provided no relief.    Past Medical History  Diagnosis Date  . Depression   . Anxiety   . Anemia     as child  . Pseudoseizure     Past Surgical History  Procedure Date  . Tonsillectomy   . Colonoscopy 05/16/2011    Procedure: COLONOSCOPY;  Surgeon: Arlyce Harman, MD;  [facility]: [facility];  Service: Endoscopy;  Laterality: N/A;  12:10    Family History  Problem Relation Age of Onset  . Colon cancer Neg Hx     History  Substance Use Topics  . Smoking status: Current Everyday Smoker -- 0.5 packs/day    Types: Cigarettes  . Smokeless tobacco: Not on file  . Alcohol Use: No    OB History    Grav Para Term Preterm  Abortions TAB SAB Ect Mult Living                  Review of Systems  Constitutional: Negative for fever and chills.  HENT: Negative for neck pain.   Cardiovascular: Negative for chest pain.  Gastrointestinal: Negative for nausea and vomiting.  Musculoskeletal: Positive for joint swelling and arthralgias. Negative for back pain.  Skin: Negative for color change, rash and wound.  Neurological: Positive for numbness. Negative for weakness and headaches.  All other systems reviewed and are negative.    Allergies  Demerol and Lactaid  Home Medications   Current Outpatient Rx  Name Route Sig Dispense Refill  . ALPRAZOLAM 0.25 MG PO TABS Oral Take 0.25 mg by mouth as needed. Patient takes for acute anxiety not to exceed 15 tablets per month    . CITALOPRAM HYDROBROMIDE 20 MG PO TABS Oral Take 20 mg by mouth at bedtime.    . IBUPROFEN 200 MG PO TABS Oral Take 400 mg by mouth every 6 (six) hours as needed. For pain     . OVER THE COUNTER MEDICATION Oral Take 1 capsule by mouth daily. DIGESTIVE ADVANTAGE: Lactose Defense Formula       BP 121/72  Pulse 91  Temp 97.6 F (36.4 C) (Oral)  Resp 18  Ht 5\' 5"  (1.651 m)  Wt 120 lb (54.432 kg)  BMI 19.97 kg/m2  SpO2 100%  LMP 02/20/2012  Physical Exam  Nursing note and vitals reviewed.  Constitutional: She is oriented to person, place, and time. She appears well-developed and well-nourished. No distress.  HENT:  Head: Normocephalic and atraumatic.  Cardiovascular: Normal rate, regular rhythm and normal heart sounds.   Pulmonary/Chest: Effort normal and breath sounds normal.  Musculoskeletal: She exhibits edema and tenderness.       Right wrist: She exhibits tenderness and swelling. She exhibits normal range of motion, no effusion, no crepitus, no deformity and no laceration.       Arms:      Right wrist is ttp of the distal aspect.  Radial pulse is brisk, sensation intact.  CR< 2 sec.  No bruising, edema, or deformity.  Pain is  reproduced with flexion and positive Finkelstein test on the right.  Neurological: She is alert and oriented to person, place, and time. She exhibits normal muscle tone. Coordination normal.  Skin: Skin is warm and dry.    ED Course  Procedures (including critical care time)  Labs Reviewed - No data to display Dg Wrist Complete Right  02/21/2012  *RADIOLOGY REPORT*  Clinical Data: Right wrist pain, no injury  RIGHT WRIST - COMPLETE 3+ VIEW  Comparison: None.  Findings: The radiocarpal joint space appears normal.  The ulnar styloid is intact.  The carpal bones are in normal position.  IMPRESSION: Negative.  Original Report Authenticated By: Juline Patch, M.D.     velcro wrist splint applied.  Pain improved.  Remains NV intact.    MDM    ttp of the distal right wrist.  Pain is reproduced with movement.  No deformity.  Likely tendonitis.    Patient / Family / Caregiver understand and agree with initial ED impression and plan with expectations set for ED visit. Pt stable in ED with no significant deterioration in condition. Pt feels improved after observation and/or treatment in ED.    Prescribed: Naprosyn norco # 15    Kourtney Montesinos L. [place], [place] 02/21/12 1741

## 2012-02-21 NOTE — ED Notes (Signed)
Pain rt wrist for 2 days, with some numbness and tingling. No recent injury

## 2012-02-24 NOTE — ED Provider Notes (Signed)
Medical screening examination/treatment/procedure(s) were performed by non-physician practitioner and as supervising physician I was immediately available for consultation/collaboration.   Shelda Jakes, MD 02/24/12 [phone]

## 2012-03-28 ENCOUNTER — Telehealth: Payer: Self-pay | Admitting: Orthopedic Surgery

## 2012-03-29 ENCOUNTER — Emergency Department (HOSPITAL_COMMUNITY): Payer: Self-pay

## 2012-03-29 ENCOUNTER — Emergency Department (HOSPITAL_COMMUNITY)
Admission: EM | Admit: 2012-03-29 | Discharge: 2012-03-29 | Disposition: A | Discharge status: Home / Self Care | Payer: Self-pay | Attending: Emergency Medicine | Admitting: Emergency Medicine

## 2012-03-29 ENCOUNTER — Encounter (HOSPITAL_COMMUNITY): Payer: Self-pay | Admitting: *Deleted

## 2012-03-29 DIAGNOSIS — M5412 Radiculopathy, cervical region: Secondary | ICD-10-CM | POA: Insufficient documentation

## 2012-03-29 MED ORDER — HYDROCODONE-ACETAMINOPHEN 5-325 MG PO TABS: 1.0000 | ORAL_TABLET | ORAL | Status: AC | PRN

## 2012-03-29 MED ORDER — DEXAMETHASONE 4 MG PO TABS: ORAL_TABLET | ORAL | Status: DC

## 2012-03-29 MED ORDER — ONDANSETRON HCL 4 MG PO TABS
4.0000 mg | ORAL_TABLET | Freq: Once | ORAL | Status: AC
  Administered 2012-03-29: 4 mg via ORAL
  Filled 2012-03-29: qty 1

## 2012-03-29 MED ORDER — DEXAMETHASONE SODIUM PHOSPHATE 4 MG/ML IJ SOLN
8.0000 mg | Freq: Once | INTRAMUSCULAR | Status: AC
  Administered 2012-03-29: 8 mg via INTRAMUSCULAR
  Filled 2012-03-29: qty 2

## 2012-03-29 MED ORDER — HYDROCODONE-ACETAMINOPHEN 5-325 MG PO TABS
2.0000 | ORAL_TABLET | Freq: Once | ORAL | Status: AC
  Administered 2012-03-29: 2 via ORAL
  Filled 2012-03-29: qty 2

## 2012-03-29 NOTE — ED Notes (Signed)
Pain rt arm , from hand up to neck. No known injury , wearing a splint on rt forearm

## 2012-03-29 NOTE — Telephone Encounter (Signed)
Patient called to request appointment, fol/up from Emergency room visits (x2) for problem arm, elbow pain; states "tendinitis".  She has had Xray and has been given brace.  She states she is still having the pain and discomfort, and requests appointment.  States she has no insurance and had previously been approved for [employer] (hospital side.)    Relayed self-pay minimum amount and payment plan; patient also re-checking with Dept of Social Services to see if may qualify for Medicaid.  Dr. Romeo Apple to review;  Appointment pending.

## 2012-03-29 NOTE — ED Notes (Signed)
Pt with c/o pain from right wrist up to right shoulder/neck area x 2 months

## 2012-03-29 NOTE — ED Provider Notes (Signed)
History     CSN: [id number]  Arrival date & time 03/29/12  1508   First MD Initiated Contact with Patient 03/29/12 1604      Chief Complaint  Patient presents with  . Arm Pain    (Consider location/radiation/quality/duration/timing/severity/associated sxs/prior treatment) Patient is a 21 y.o. female presenting with arm pain. The history is provided by the patient.  Arm Pain This is a chronic problem. The current episode started more than 1 month ago. The problem occurs daily. The problem has been gradually worsening. Associated symptoms include arthralgias and neck pain. Pertinent negatives include no abdominal pain, chest pain or coughing. Associated symptoms comments: Right wrist pain, and elbow pain.. Exacerbated by: any movement of the right wrist, elbow, shoulder or neck. Treatments tried: wrist cock-up splint, naprosyn and motrin. The treatment provided no relief.    Past Medical History  Diagnosis Date  . Depression   . Anxiety   . Anemia     as child  . Pseudoseizure     Past Surgical History  Procedure Date  . Tonsillectomy   . Colonoscopy 05/16/2011    Procedure: COLONOSCOPY;  Surgeon: Arlyce Harman, MD;  Location: [facility];  Service: Endoscopy;  Laterality: N/A;  12:10    Family History  Problem Relation Age of Onset  . Colon cancer Neg Hx     History  Substance Use Topics  . Smoking status: Current Everyday Smoker -- 0.5 packs/day    Types: Cigarettes  . Smokeless tobacco: Not on file  . Alcohol Use: No    OB History    Grav Para Term Preterm Abortions TAB SAB Ect Mult Living                  Review of Systems  Constitutional: Negative for activity change.       All ROS Neg except as noted in HPI  HENT: Positive for neck pain. Negative for nosebleeds.   Eyes: Negative for photophobia and discharge.  Respiratory: Negative for cough, shortness of breath and wheezing.   Cardiovascular: Negative for chest pain and palpitations.    Gastrointestinal: Negative for abdominal pain and blood in stool.  Genitourinary: Negative for dysuria, frequency and hematuria.  Musculoskeletal: Positive for arthralgias. Negative for back pain.  Skin: Negative.   Neurological: Negative for dizziness, seizures and speech difficulty.  Psychiatric/Behavioral: Negative for hallucinations and confusion. The patient is nervous/anxious.        Depression    Allergies  Demerol and Lactaid  Home Medications   Current Outpatient Rx  Name Route Sig Dispense Refill  . CITALOPRAM HYDROBROMIDE 20 MG PO TABS Oral Take 20 mg by mouth at bedtime.    . IBUPROFEN 200 MG PO TABS Oral Take 400 mg by mouth every 6 (six) hours as needed. For pain     . NAPROXEN 500 MG PO TABS Oral Take 1 tablet (500 mg total) by mouth 2 (two) times daily. Take with food 20 tablet 0  . NAPROXEN SODIUM 220 MG PO CAPS Oral Take 440 mg by mouth as needed.    Marland Kitchen OVER THE COUNTER MEDICATION Oral Take 1 capsule by mouth daily. DIGESTIVE ADVANTAGE: Lactose Defense Formula     . TRIAMCINOLONE ACETONIDE 0.1 % EX CREA Topical Apply 1 application topically daily.    Marland Kitchen DEXAMETHASONE 4 MG PO TABS  1 po daily with food 6 tablet 0  . HYDROCODONE-ACETAMINOPHEN 5-325 MG PO TABS Oral Take 1 tablet by mouth every 4 (four) hours as  needed for pain. 15 tablet 0    BP 108/66  Pulse 100  Temp 98.4 F (36.9 C) (Oral)  Resp 18  Ht 5\' 4"  (1.626 m)  Wt 120 lb (54.432 kg)  BMI 20.60 kg/m2  LMP 03/21/2012  Physical Exam  Nursing note and vitals reviewed. Constitutional: She is oriented to person, place, and time. She appears well-developed and well-nourished.  Non-toxic appearance.  HENT:  Head: Normocephalic.  Right Ear: Tympanic membrane and external ear normal.  Left Ear: Tympanic membrane and external ear normal.  Eyes: EOM and lids are normal. Pupils are equal, round, and reactive to light.  Neck: Normal range of motion. Neck supple. Carotid bruit is not present.  Cardiovascular:  Normal rate, regular rhythm, normal heart sounds, intact distal pulses and normal pulses.   Pulmonary/Chest: Breath sounds normal. No respiratory distress.  Abdominal: Soft. Bowel sounds are normal. There is no tenderness. There is no guarding.  Musculoskeletal: Normal range of motion.  Lymphadenopathy:       Head (right side): No submandibular adenopathy present.       Head (left side): No submandibular adenopathy present.    She has no cervical adenopathy.  Neurological: She is alert and oriented to person, place, and time. She has normal strength. No cranial nerve deficit or sensory deficit.  Skin: Skin is warm and dry.  Psychiatric: She has a normal mood and affect. Her speech is normal.    ED Course  Procedures (including critical care time)  Labs Reviewed - No data to display No results found.   1. Cervical radicular pain       MDM  I have reviewed nursing notes, vital signs, and all appropriate lab and imaging results for this patient. Exam is consistentent with chronic radicular pain. No acute deficit noted.  Pt advised to see she specialist as soon as possible. Rx for decadron given to add to the current medications.       Kathie Dike, [place] 04/16/12 [phone]

## 2012-03-31 NOTE — Telephone Encounter (Signed)
Just follow normal policy  Not fractured has to pa th regular amount for uninsured dont think this needed a review just follow our policy   In review: er visit not a fracture, unisured, [facility] side only: have to pay our self pay amount

## 2012-04-03 NOTE — Telephone Encounter (Signed)
Called back to patient to follow up - she is aware.

## 2012-04-16 NOTE — ED Provider Notes (Signed)
Medical screening examination/treatment/procedure(s) were performed by non-physician practitioner and as supervising physician I was immediately available for consultation/collaboration. Devoria Albe, MD, [facility]   Ward Givens, MD 04/16/12 [phone]

## 2012-05-06 IMAGING — CT CT HEAD W/O CM
1 series · 16 of 30 positions shown, 20 images · non-contrast
Comparison: None.

CLINICAL DATA: Seizure activity

CT HEAD WITHOUT CONTRAST
TECHNIQUE: Contiguous axial images were obtained from the base of
the skull through the vertex without contrast

[Series 2: headseq 4.8 h37s · axial · 0.43mm/px · z∈[+139,+270]mm · 16 of 30 slices shown, 20 images]
[im 2/30  brain]
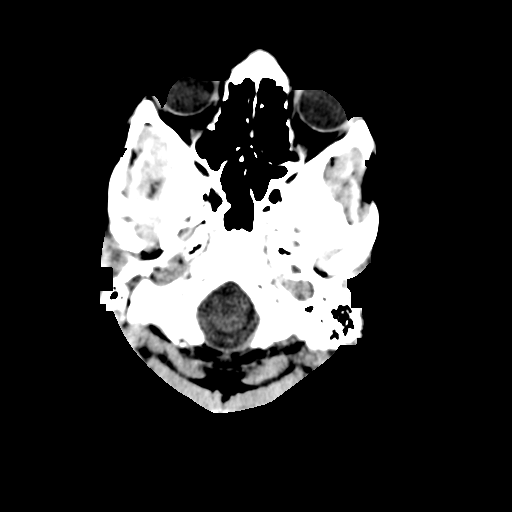
[im 2/30  bone]
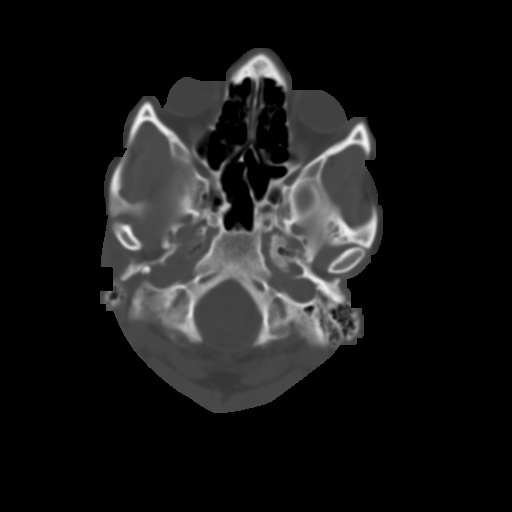
[im 4/30  brain]
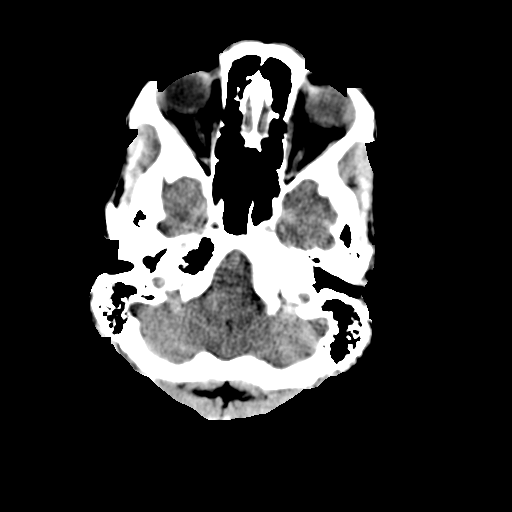
[im 6/30  brain]
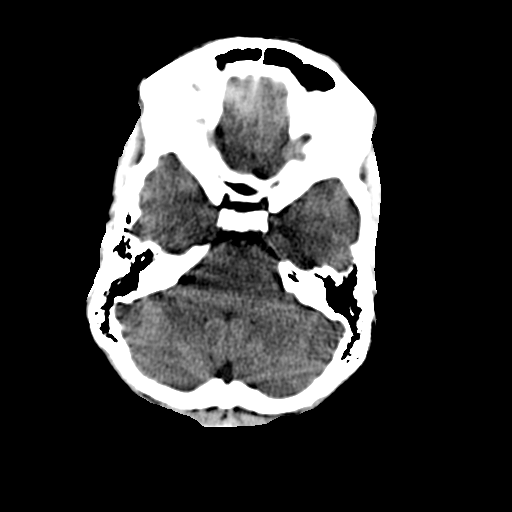
[im 8/30  brain]
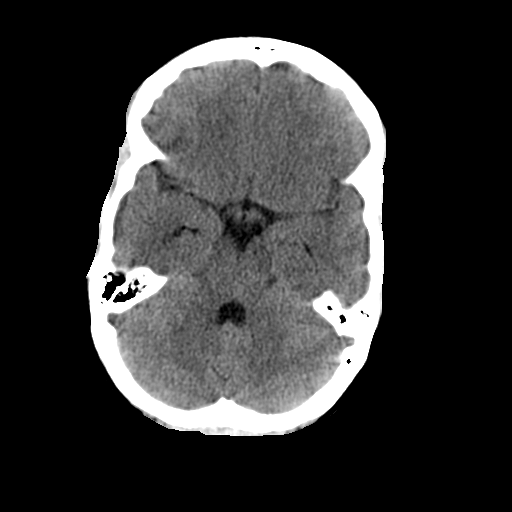
[im 9/30  brain]
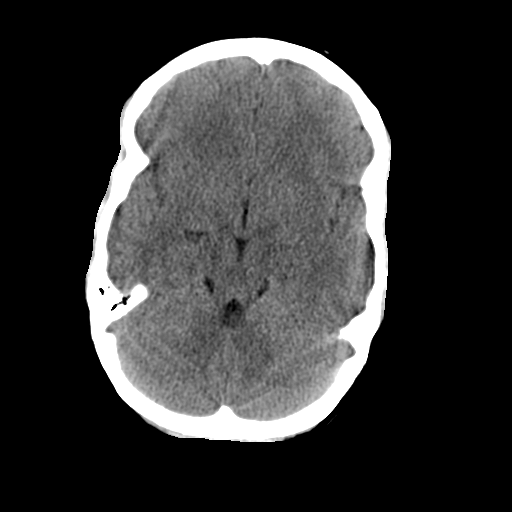
[im 9/30  bone]
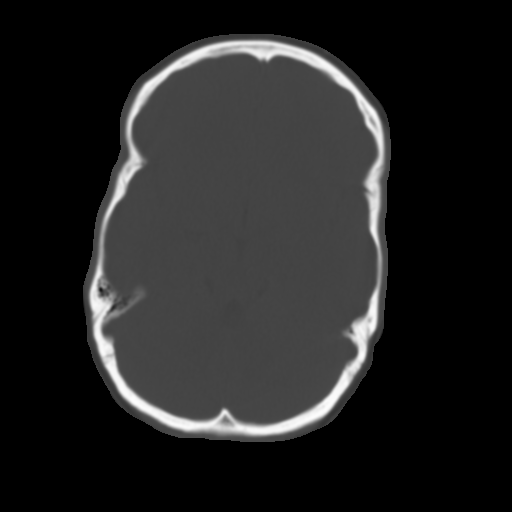
[im 11/30  brain]
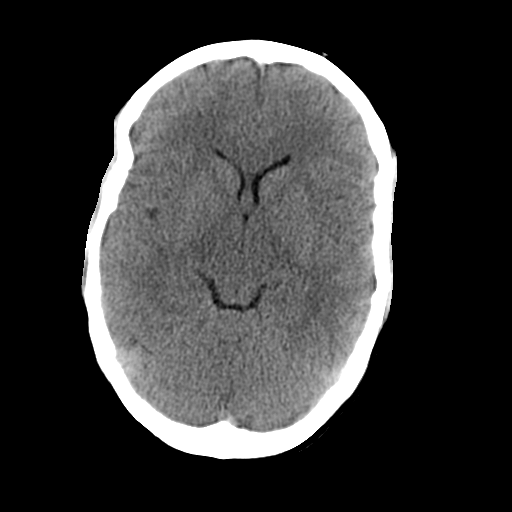
[im 13/30  brain]
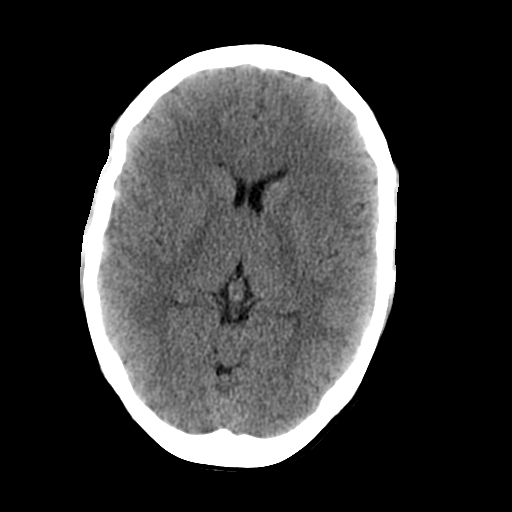
[im 15/30  brain]
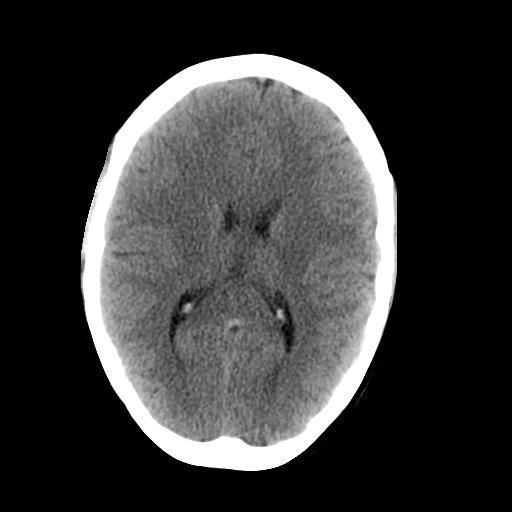
[im 16/30  brain]
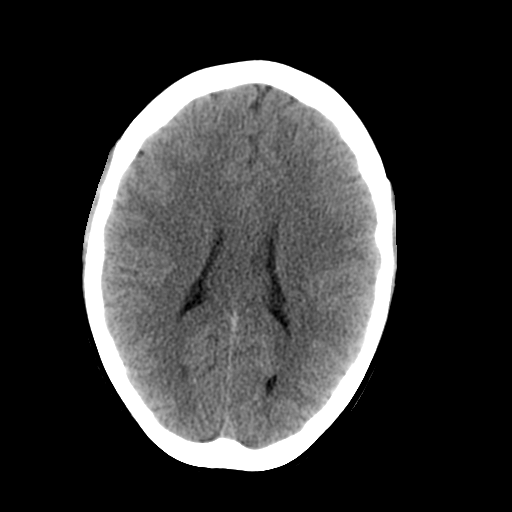
[im 16/30  bone]
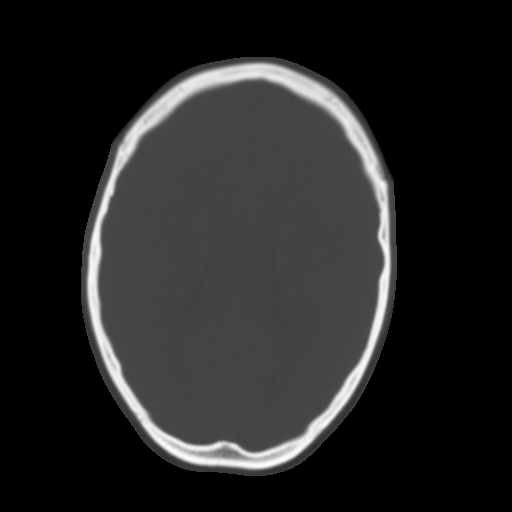
[im 18/30  brain]
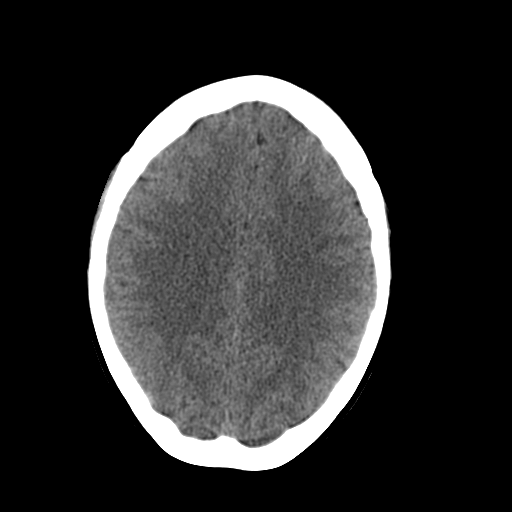
[im 20/30  brain]
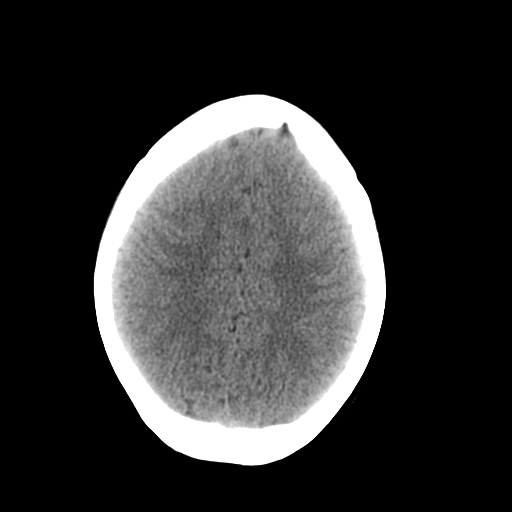
[im 22/30  brain]
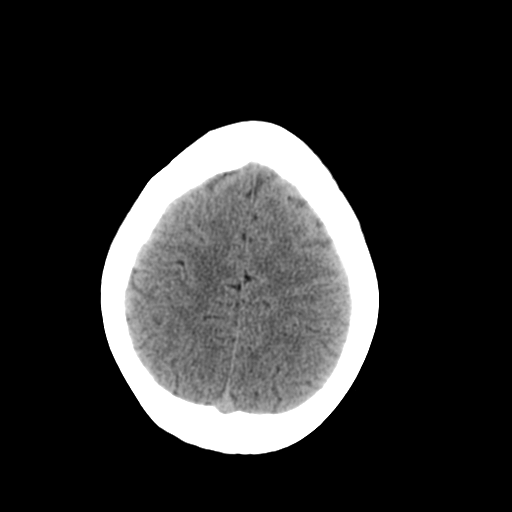
[im 23/30  brain]
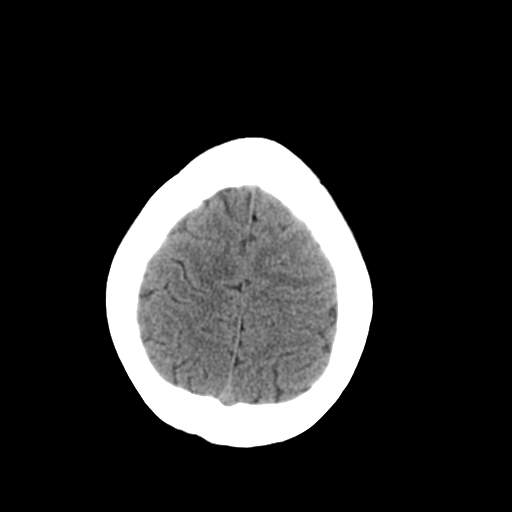
[im 23/30  bone]
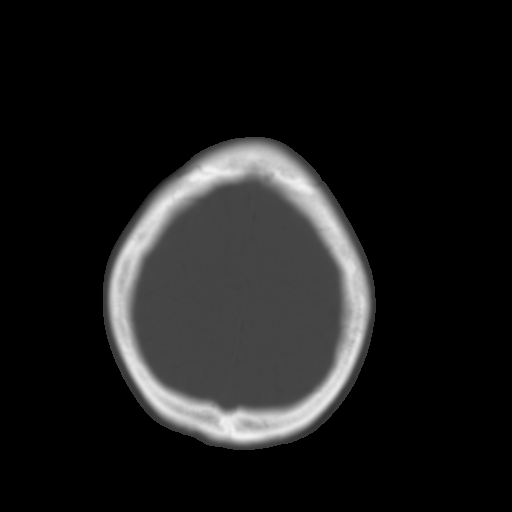
[im 25/30  brain]
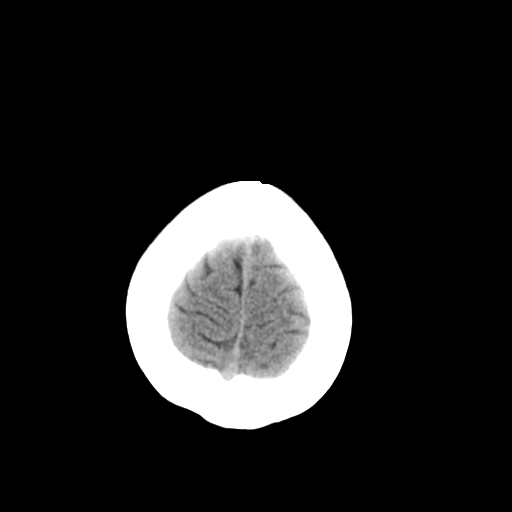
[im 27/30  brain]
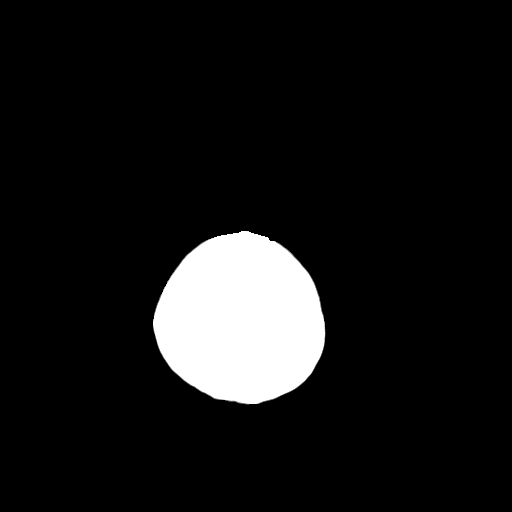
[im 29/30  brain]
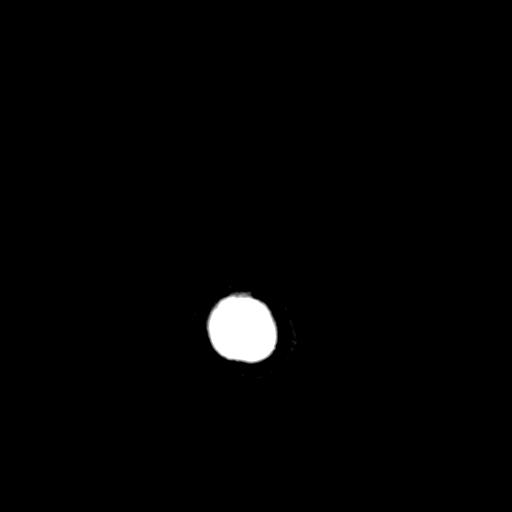

[16 of 30 positions shown; findings below may reference images not displayed]

FINDINGS: The brain has a normal appearance without evidence for
hemorrhage, acute infarction, hydrocephalus, or mass lesion.  There
is no extra axial fluid collection.  The skull and paranasal
sinuses are normal.
IMPRESSION: Normal CT of the head without contrast.

## 2012-10-30 ENCOUNTER — Emergency Department (HOSPITAL_COMMUNITY)
Admission: EM | Admit: 2012-10-30 | Discharge: 2012-10-30 | Disposition: A | Discharge status: Home / Self Care | Payer: Self-pay | Attending: Emergency Medicine | Admitting: Emergency Medicine

## 2012-10-30 DIAGNOSIS — F17200 Nicotine dependence, unspecified, uncomplicated: Secondary | ICD-10-CM | POA: Insufficient documentation

## 2012-10-30 DIAGNOSIS — Z791 Long term (current) use of non-steroidal anti-inflammatories (NSAID): Secondary | ICD-10-CM | POA: Insufficient documentation

## 2012-10-30 DIAGNOSIS — F329 Major depressive disorder, single episode, unspecified: Secondary | ICD-10-CM | POA: Insufficient documentation

## 2012-10-30 DIAGNOSIS — F411 Generalized anxiety disorder: Secondary | ICD-10-CM | POA: Insufficient documentation

## 2012-10-30 DIAGNOSIS — K5900 Constipation, unspecified: Secondary | ICD-10-CM | POA: Insufficient documentation

## 2012-10-30 DIAGNOSIS — Z79899 Other long term (current) drug therapy: Secondary | ICD-10-CM | POA: Insufficient documentation

## 2012-10-30 DIAGNOSIS — Z862 Personal history of diseases of the blood and blood-forming organs and certain disorders involving the immune mechanism: Secondary | ICD-10-CM | POA: Insufficient documentation

## 2012-10-30 DIAGNOSIS — Z8669 Personal history of other diseases of the nervous system and sense organs: Secondary | ICD-10-CM | POA: Insufficient documentation

## 2012-10-30 DIAGNOSIS — F3289 Other specified depressive episodes: Secondary | ICD-10-CM | POA: Insufficient documentation

## 2012-10-30 MED ORDER — PEG 3350-KCL-NA BICARB-NACL 420 G PO SOLR
4000.0000 mL | Freq: Once | ORAL | Status: DC
  Filled 2012-10-30: qty 4000

## 2012-10-30 NOTE — ED Provider Notes (Signed)
History     CSN: [id number]  Arrival date & time 10/30/12  0016   First MD Initiated Contact with Patient 10/30/12 0206      Chief Complaint  Patient presents with  . Abdominal Pain    (Consider location/radiation/quality/duration/timing/severity/associated sxs/prior treatment) HPI Kayla Hardy is a 22 y.o. female who presents to the Emergency Department complaining of constipation that has been on going for 3 years. She has been using stool softeners daily, occasional enemas, miralax daily. This past Sunday she went to the bathroom and it had been a week. She still feels "full".  PCP Dr. McGough Past Medical History  Diagnosis Date  . Depression   . Anxiety   . Anemia     as child  . Pseudoseizure     Past Surgical History  Procedure Laterality Date  . Tonsillectomy    . Colonoscopy  05/16/2011    Procedure: COLONOSCOPY;  Surgeon: Sandi M Fields, MD;  Location: [facility];  Service: Endoscopy;  Laterality: N/A;  12:10    Family History  Problem Relation Age of Onset  . Colon cancer Neg Hx     History  Substance Use Topics  . Smoking status: Current Every Day Smoker -- 0.50 packs/day    Types: Cigarettes  . Smokeless tobacco: Not on file  . Alcohol Use: No    OB History   Grav Para Term Preterm Abortions TAB SAB Ect Mult Living                  Review of Systems  Constitutional: Negative for fever.       10  Systems reviewed and are negative for acute change except as noted in the HPI.  HENT: Negative for congestion.   Eyes: Negative for discharge and redness.  Respiratory: Negative for cough and shortness of breath.   Cardiovascular: Negative for chest pain.  Gastrointestinal: Positive for constipation. Negative for vomiting and abdominal pain.  Musculoskeletal: Negative for back pain.  Skin: Negative for rash.  Neurological: Negative for syncope, numbness and headaches.  Psychiatric/Behavioral:       No behavior change.    Allergies   Demerol and Lactaid  Home Medications   Current Outpatient Rx  Name  Route  Sig  Dispense  Refill  . citalopram (CELEXA) 20 MG tablet   Oral   Take 20 mg by mouth at bedtime.         Marland Kitchen dexamethasone (DECADRON) 4 MG tablet      1 po daily with food   6 tablet   0   . ibuprofen (ADVIL,MOTRIN) 200 MG tablet   Oral   Take 400 mg by mouth every 6 (six) hours as needed. For pain          . naproxen (NAPROSYN) 500 MG tablet   Oral   Take 1 tablet (500 mg total) by mouth 2 (two) times daily. Take with food   20 tablet   0   . Naproxen Sodium (ALEVE) 220 MG CAPS   Oral   Take 440 mg by mouth as needed.         Marland Kitchen OVER THE COUNTER MEDICATION   Oral   Take 1 capsule by mouth daily. DIGESTIVE ADVANTAGE: Lactose Defense Formula          . triamcinolone cream (KENALOG) 0.1 %   Topical   Apply 1 application topically daily.           BP 113/69  Pulse 75  Temp(Src) 98.5 F (36.9 C) (Oral)  Resp 16  SpO2 100%  Physical Exam  Nursing note and vitals reviewed. Constitutional: She appears well-developed and well-nourished.  Awake, alert, nontoxic appearance.  HENT:  Head: Normocephalic and atraumatic.  Right Ear: External ear normal.  Left Ear: External ear normal.  Mouth/Throat: Oropharynx is clear and moist.  Eyes: EOM are normal. Pupils are equal, round, and reactive to light. Right eye exhibits no discharge. Left eye exhibits no discharge.  Neck: Neck supple.  Cardiovascular: Normal rate.   Pulmonary/Chest: Effort normal and breath sounds normal. She exhibits no tenderness.  Abdominal: Soft. Bowel sounds are normal. There is no tenderness. There is no rebound.  Musculoskeletal: She exhibits no tenderness.  Baseline ROM, no obvious new focal weakness.  Neurological:  Mental status and motor strength appears baseline for patient and situation.  Skin: No rash noted.  Psychiatric: She has a normal mood and affect.    ED Course  Procedures (including  critical care time)    MDM  Patient with a h/o constipation. Discussed the use of the products she has to maximize her effort. Will give her a jug of Golytely. She is to follow up with Dr. Darrick Penna. She will increase her Miralax to twice a day.Pt stable in ED with no significant deterioration in condition.The patient appears reasonably screened and/or stabilized for discharge and I doubt any other medical condition or other [facility] requiring further screening, evaluation, or treatment in the ED at this time prior to discharge.  MDM Reviewed: nursing note and vitals           Nicoletta Dress. [place], MD 10/30/12 [phone]

## 2012-10-30 NOTE — ED Notes (Signed)
Pt states that she has the same medication at home and since nothing else will be done, she is going home.

## 2012-10-30 NOTE — ED Notes (Signed)
Came out of another patient room, patient not in room. Charge nurse stated patient had been discharged, refused golytely medication ordered and refused to sign paperwork

## 2012-10-30 NOTE — ED Notes (Signed)
Pt reporting abdominal pain, nausea and dizziness starting today.

## 2012-10-30 NOTE — Discharge Instructions (Signed)
Fill the jug with water and put it in the refrigerator. Use a cup every 4 hours. Once you have had several bowel movements, you can stop using the jug or use a cup daily until the jug is gone. Follow up with your GI doctor. Continue to use Miralax twice a day.    Constipation, Adult Constipation is when a person:  Poops (bowel movement) less than 3 times a week.  Has a hard time pooping.  Has poop that is dry, hard, or bigger than normal. HOME CARE   Eat more fiber, such as fruits, vegetables, whole grains like brown rice, and beans.  Eat less fatty foods and sugar. This includes [place] fries, hamburgers, cookies, candy, and soda.  If you are not getting enough fiber from food, take products with added fiber in them (supplements).  Drink enough fluid to keep your pee (urine) clear or pale yellow.  Go to the restroom when you feel like you need to poop. Do not hold it.  Only take medicine as told by your doctor. Do not take medicines that help you poop (laxatives) without talking to your doctor first.  Exercise on a regular basis, or as told by your doctor. GET HELP RIGHT AWAY IF:   You have bright red blood in your poop (stool).  Your constipation lasts more than 4 days or gets worse.  You have belly (abdomen) or butt (rectal) pain.  You have thin poop (as thin as a pencil).  You lose weight, and it cannot be explained. MAKE SURE YOU:   Understand these instructions.  Will watch your condition.  Will get help right away if you are not doing well or get worse. Document Released: 01/24/2008 Document Revised: 10/30/2011 Document Reviewed: 07/11/2011 [facility] Patient Information 2013 [place], [place].

## 2013-01-27 ENCOUNTER — Emergency Department (HOSPITAL_COMMUNITY)
Admission: EM | Admit: 2013-01-27 | Discharge: 2013-01-27 | Disposition: A | Discharge status: Home / Self Care | Payer: Self-pay | Attending: Emergency Medicine | Admitting: Emergency Medicine

## 2013-01-27 ENCOUNTER — Encounter (HOSPITAL_COMMUNITY): Payer: Self-pay

## 2013-01-27 DIAGNOSIS — L299 Pruritus, unspecified: Secondary | ICD-10-CM | POA: Insufficient documentation

## 2013-01-27 DIAGNOSIS — IMO0002 Reserved for concepts with insufficient information to code with codable children: Secondary | ICD-10-CM | POA: Insufficient documentation

## 2013-01-27 DIAGNOSIS — Z79899 Other long term (current) drug therapy: Secondary | ICD-10-CM | POA: Insufficient documentation

## 2013-01-27 DIAGNOSIS — F3289 Other specified depressive episodes: Secondary | ICD-10-CM | POA: Insufficient documentation

## 2013-01-27 DIAGNOSIS — F329 Major depressive disorder, single episode, unspecified: Secondary | ICD-10-CM | POA: Insufficient documentation

## 2013-01-27 DIAGNOSIS — Z872 Personal history of diseases of the skin and subcutaneous tissue: Secondary | ICD-10-CM | POA: Insufficient documentation

## 2013-01-27 DIAGNOSIS — F411 Generalized anxiety disorder: Secondary | ICD-10-CM | POA: Insufficient documentation

## 2013-01-27 DIAGNOSIS — R21 Rash and other nonspecific skin eruption: Secondary | ICD-10-CM | POA: Insufficient documentation

## 2013-01-27 DIAGNOSIS — F17200 Nicotine dependence, unspecified, uncomplicated: Secondary | ICD-10-CM | POA: Insufficient documentation

## 2013-01-27 DIAGNOSIS — Z862 Personal history of diseases of the blood and blood-forming organs and certain disorders involving the immune mechanism: Secondary | ICD-10-CM | POA: Insufficient documentation

## 2013-01-27 DIAGNOSIS — Z8669 Personal history of other diseases of the nervous system and sense organs: Secondary | ICD-10-CM | POA: Insufficient documentation

## 2013-01-27 MED ORDER — PREDNISONE 10 MG PO TABS: ORAL_TABLET | ORAL | Status: DC

## 2013-01-27 MED ORDER — METHYLPREDNISOLONE SODIUM SUCC 125 MG IJ SOLR
125.0000 mg | Freq: Once | INTRAMUSCULAR | Status: AC
  Administered 2013-01-27: 125 mg via INTRAMUSCULAR
  Filled 2013-01-27: qty 2

## 2013-01-27 MED ORDER — HYDROXYZINE HCL 25 MG PO TABS
25.0000 mg | ORAL_TABLET | Freq: Once | ORAL | Status: AC
  Administered 2013-01-27: 25 mg via ORAL
  Filled 2013-01-27: qty 1

## 2013-01-27 MED ORDER — HYDROXYZINE HCL 25 MG PO TABS: ORAL_TABLET | ORAL | Status: DC

## 2013-01-27 NOTE — ED Provider Notes (Signed)
History     CSN: [id number]  Arrival date & time 01/27/13  1806   First MD Initiated Contact with Patient 01/27/13 2032      Chief Complaint  Patient presents with  . Rash    (Consider location/radiation/quality/duration/timing/severity/associated sxs/prior treatment) Patient is a 22 y.o. female presenting with rash. The history is provided by the patient.  Rash Onset quality:  Gradual Duration:  3 weeks Timing:  Intermittent Progression:  Worsening Chronicity:  New Context comment:  Pt has hx of eczema, but was mowing and noted red area on the upper arms the next day. Relieved by:  Nothing Exacerbated by: scratching. Ineffective treatments:  None tried Associated symptoms: no chest pain, no cough, no dysuria, no hematuria and no shortness of breath   Associated symptoms comment:  Itching of the posterior neck.   Past Medical History  Diagnosis Date  . Depression   . Anxiety   . Anemia     as child  . Pseudoseizure     Past Surgical History  Procedure Laterality Date  . Tonsillectomy    . Colonoscopy  05/16/2011    Procedure: COLONOSCOPY;  Surgeon: Arlyce Harman, MD;  Location: [facility];  Service: Endoscopy;  Laterality: N/A;  12:10    Family History  Problem Relation Age of Onset  . Colon cancer Neg Hx     History  Substance Use Topics  . Smoking status: Current Every Day Smoker -- 0.50 packs/day    Types: Cigarettes  . Smokeless tobacco: Not on file  . Alcohol Use: No    OB History   Grav Para Term Preterm Abortions TAB SAB Ect Mult Living                  Review of Systems  Constitutional: Negative for activity change.       All ROS Neg except as noted in HPI  HENT: Negative for nosebleeds and neck pain.   Eyes: Negative for photophobia and discharge.  Respiratory: Negative for cough, shortness of breath and wheezing.   Cardiovascular: Negative for chest pain and palpitations.  Gastrointestinal: Negative for abdominal pain and blood in  stool.  Genitourinary: Negative for dysuria, frequency and hematuria.  Musculoskeletal: Negative for back pain and arthralgias.  Skin: Positive for rash.  Neurological: Negative for dizziness, seizures and speech difficulty.  Psychiatric/Behavioral: Negative for hallucinations and confusion. The patient is nervous/anxious.        Depression    Allergies  Demerol and Lactaid  Home Medications   Current Outpatient Rx  Name  Route  Sig  Dispense  Refill  . citalopram (CELEXA) 20 MG tablet   Oral   Take 20 mg by mouth at bedtime.         Marland Kitchen dexamethasone (DECADRON) 4 MG tablet      1 po daily with food   6 tablet   0   . ibuprofen (ADVIL,MOTRIN) 200 MG tablet   Oral   Take 400 mg by mouth every 6 (six) hours as needed. For pain          . naproxen (NAPROSYN) 500 MG tablet   Oral   Take 1 tablet (500 mg total) by mouth 2 (two) times daily. Take with food   20 tablet   0   . Naproxen Sodium (ALEVE) 220 MG CAPS   Oral   Take 440 mg by mouth as needed.         Marland Kitchen OVER THE COUNTER MEDICATION  Oral   Take 1 capsule by mouth daily. DIGESTIVE ADVANTAGE: Lactose Defense Formula          . triamcinolone cream (KENALOG) 0.1 %   Topical   Apply 1 application topically daily.           BP 125/77  Pulse 79  Temp(Src) 98.5 F (36.9 C) (Oral)  Resp 20  Ht 5\' 5"  (1.651 m)  Wt 135 lb (61.236 kg)  BMI 22.47 kg/m2  SpO2 100%  Physical Exam  Nursing note and vitals reviewed. Constitutional: She is oriented to person, place, and time. She appears well-developed and well-nourished.  Non-toxic appearance.  HENT:  Head: Normocephalic.  Right Ear: Tympanic membrane and external ear normal.  Left Ear: Tympanic membrane and external ear normal.  Eyes: EOM and lids are normal. Pupils are equal, round, and reactive to light.  Neck: Normal range of motion. Neck supple. Carotid bruit is not present.  Cardiovascular: Normal rate, regular rhythm, normal heart sounds, intact  distal pulses and normal pulses.   Pulmonary/Chest: Breath sounds normal. No respiratory distress. She has no wheezes. She has no rales.  Abdominal: Soft. Bowel sounds are normal. There is no tenderness. There is no guarding.  Musculoskeletal: Normal range of motion.  Lymphadenopathy:       Head (right side): No submandibular adenopathy present.       Head (left side): No submandibular adenopathy present.    She has no cervical adenopathy.  Neurological: She is alert and oriented to person, place, and time. She has normal strength. No cranial nerve deficit or sensory deficit.  Skin: Skin is warm and dry.  Streaking red rash of both upper arms. Multiple healing scratch areas. One area of mild redness of the posterior neck.   Psychiatric: She has a normal mood and affect. Her speech is normal.    ED Course  Procedures (including critical care time)  Labs Reviewed - No data to display No results found.   No diagnosis found.    MDM  I have reviewed nursing notes, vital signs, and all appropriate lab and imaging results for this patient. Patient presents to the emergency department with complaint of rash for nearly 3 weeks. She has a history of eczema. But she states that this rash started after mowing. The rash is in linear streaks possibly consistent with contact type dermatitis.  Plan at this time is for the patient to receive Solu-Medrol 125 mg in the emergency department. Prescription for prednisone taper and Vistaril given to the patient. Patient advised to see her primary physician, or return to the emergency department if any changes, problems, or concerns.       Kathie Dike, PA-C 01/27/13 2054

## 2013-01-27 NOTE — ED Notes (Signed)
Itching rash to bil upper arms, back and scalp for 3 weeks.  Has been taking benadry without relief.  Has hx of eczema.

## 2013-01-27 NOTE — ED Notes (Signed)
Pt has red streaked rash to upper arms, neck, head, and back x 3 weeks.  C/O severe itching.

## 2013-01-30 NOTE — ED Provider Notes (Signed)
Medical screening examination/treatment/procedure(s) were performed by non-physician practitioner and as supervising physician I was immediately available for consultation/collaboration.   Jaloni Sorber M Yakov Bergen, DO 01/30/13 0049 

## 2013-01-31 ENCOUNTER — Encounter (HOSPITAL_COMMUNITY): Payer: Self-pay

## 2013-01-31 ENCOUNTER — Emergency Department (HOSPITAL_COMMUNITY)
Admission: EM | Admit: 2013-01-31 | Discharge: 2013-01-31 | Disposition: A | Discharge status: Home / Self Care | Payer: BC Managed Care – PPO | Attending: Emergency Medicine | Admitting: Emergency Medicine

## 2013-01-31 DIAGNOSIS — F411 Generalized anxiety disorder: Secondary | ICD-10-CM | POA: Insufficient documentation

## 2013-01-31 DIAGNOSIS — F17200 Nicotine dependence, unspecified, uncomplicated: Secondary | ICD-10-CM | POA: Insufficient documentation

## 2013-01-31 DIAGNOSIS — Z79899 Other long term (current) drug therapy: Secondary | ICD-10-CM | POA: Insufficient documentation

## 2013-01-31 DIAGNOSIS — R569 Unspecified convulsions: Secondary | ICD-10-CM | POA: Insufficient documentation

## 2013-01-31 DIAGNOSIS — F3289 Other specified depressive episodes: Secondary | ICD-10-CM | POA: Insufficient documentation

## 2013-01-31 DIAGNOSIS — F329 Major depressive disorder, single episode, unspecified: Secondary | ICD-10-CM | POA: Insufficient documentation

## 2013-01-31 DIAGNOSIS — F445 Conversion disorder with seizures or convulsions: Secondary | ICD-10-CM

## 2013-01-31 DIAGNOSIS — Z862 Personal history of diseases of the blood and blood-forming organs and certain disorders involving the immune mechanism: Secondary | ICD-10-CM | POA: Insufficient documentation

## 2013-01-31 DIAGNOSIS — R000 Tachycardia, unspecified: Secondary | ICD-10-CM | POA: Insufficient documentation

## 2013-01-31 NOTE — ED Provider Notes (Signed)
History    This chart was scribed for Hurman Horn, MD by Quintella Reichert, ED scribe.  This patient was seen in room [facility] and the patient's care was started at 3:59 PM.   CSN: [id number]  Arrival date & time 01/31/13  1547      Chief Complaint: Seizure-Like Activity    The history is provided by a friend. The history is limited by the condition of the patient. No language interpreter was used.   Level 5 Caveat: Patient nonverbal  HPI Comments: Kayla Hardy is a 22 y.o. female with h/o seizures vs pseudoseizures diagnosis who presents to the Emergency Department complaining of seizure-like activity that occurred suddenly while.pt was in the car several hours ago.  Symptoms present as episodic generalized shaking movements of the entire upper body without rhythmic tonic, clonic extension-type movements lasting less than 1 minute at a time.  In between spells pt wakes up immediately, looks around the room with eyes and tracks, and moves her head to answer yes-no questions, but is nonverbal..  Nurse reports that pt speaks to her friend when physician leaves the room.  Through yes-no questions pt states she does not know who she is or where she is.  Pt had 2 witnessed spells of seizure-like activity in ED, more consistent with pseudoseizure.  Pt's friend notes that pt was taken to [facility] last week for the same symptoms, and was not determined to have any serious medical condition.  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  However her friend reports that pt's new physician believes there may be a neurological origin to symptoms.  Pt also has h/o anxiety and depression.  Pt's friend denies pt recently exhibiting SI, HI, dysphoric mood, decreased appetite, confusion, weakness or any other associated symptoms.  She denies pt having h/o DM or asthma.     Pt [facility] at [facility].   Past Medical History  Diagnosis Date  . Depression   . Anxiety   . Anemia     as child  . Pseudoseizure     Past Surgical History  Procedure Laterality Date  . Tonsillectomy    . Colonoscopy  05/16/2011    Procedure: COLONOSCOPY;  Surgeon: Arlyce Harman, MD;  Location: [facility];  Service: Endoscopy;  Laterality: N/A;  12:10    Family History  Problem Relation Age of Onset  . Colon cancer Neg Hx     History  Substance Use Topics  . Smoking status: Current Every Day Smoker -- 0.50 packs/day    Types: Cigarettes  . Smokeless tobacco: Not on file  . Alcohol Use: No    OB History   Grav Para Term Preterm Abortions TAB SAB Ect Mult Living                  Review of Systems  Unable to perform ROS: Patient nonverbal      Allergies  Demerol and Lactaid  Home Medications   Current Outpatient Rx  Name  Route  Sig  Dispense  Refill  . citalopram (CELEXA) 20 MG tablet   Oral   Take 30 mg by mouth at bedtime.         . hydrOXYzine (ATARAX/VISTARIL) 25 MG tablet   Oral   Take 25 mg by mouth every 6 (six) hours as needed for itching. 1 at hs, or  q6h prn itching/burning rash         . Naproxen Sodium (ALEVE) 220 MG CAPS   Oral   Take 440 mg by mouth daily as needed (for pain).          . predniSONE (DELTASONE) 10 MG tablet   Oral   Take 10-50 mg by mouth at bedtime. 5,4,3,2,1 - take with food           BP 117/69  Pulse 108  Temp(Src) 99.5 F (37.5 C) (Rectal)  Resp 24  SpO2 100%  Physical Exam  Nursing note and vitals reviewed. Constitutional:  Anxious and tearful but Awake, alert, nontoxic appearance.  HENT:  Head: Atraumatic.  Eyes: Right eye exhibits no discharge. Left eye exhibits no discharge.  Neck: Neck supple.  Cardiovascular: Regular rhythm and normal heart sounds.  Tachycardia present.   No murmur heard. Pulmonary/Chest: Effort normal. She has no wheezes. She has no rales.  She exhibits no tenderness.  Abdominal: Soft. Bowel sounds are normal. There is no tenderness. There is no rebound.  Musculoskeletal: She exhibits no tenderness.  Baseline ROM, no obvious new focal weakness.  Neurological: She is alert.  Awake alert and tracks with eyes easily extraocular movements intact peripheral visual fields full to confrontation no facial asymmetry normal tongue movements no tongue biting is moves all 4 extremities symmetrically with no pronator drift in her arms either unable or uncooperative when trying to perform finger to nose testing but moves all 4 extremities spontaneously without apparent focal or lateralizing weakness numbness or head yes or no to answer simple questions initially seemingly does not know who she is where she is or why she is here seemingly disoriented to person place and time  Skin: No rash noted.  Psychiatric:  Anxious and tearful but denies suicidal or homicidal ideation or hallucinations    ED Course  Procedures (including critical care time)  DIAGNOSTIC STUDIES: Oxygen Saturation is 100% on room air, normal by my interpretation.    COORDINATION OF CARE: 4:07 PM-Discussed treatment plan which includes labs with pt's friend at bedside and she agreed to plan.   Caregiver understands and agrees with initial ED impression and plan with expectations set for ED visit.   5:07 PM: Records from [facility] reviewed.  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  Pt has prior Ct-scans, MRI scans, and multiple EEGs, as well as evaluations by neurology, with suspected seizure-like activity prompted by anxiety.  5:58 PM: Pt is awake, alert, and oriented x3.  Normal speech, normal gait, normal bilateral finger-to-nose testing.  Labs Reviewed  GLUCOSE, CAPILLARY - Abnormal; Notable for the following:    Glucose-Capillary 110 (*)    All other components within normal limits   No results found.   1. Pseudoseizure       MDM  I  personally performed the services described in this documentation, which was scribed in my presence. The recorded information has been reviewed and is accurate. Patient / Family / Caregiver informed of clinical course, understand medical decision-making process, and agree with plan. I doubt any other EMC precluding discharge at this time including, but not necessarily limited to the following:new onset seizures.   Hurman Horn, MD 02/01/13 1311

## 2013-01-31 NOTE — ED Notes (Signed)
Pt now speaking. Alert and oriented, crying. Able to state name of practitioner at [facility].

## 2013-01-31 NOTE — ED Notes (Signed)
Pt brought to ED by friend. Pt lifted out of car by security and a nurse. Pt will not open her eyes, will not speak and has jerky movement to upper body lasting a few seconds

## 2013-02-27 ENCOUNTER — Encounter (HOSPITAL_COMMUNITY): Payer: Self-pay | Admitting: Emergency Medicine

## 2013-02-27 ENCOUNTER — Emergency Department (HOSPITAL_COMMUNITY): Payer: BC Managed Care – PPO

## 2013-02-27 ENCOUNTER — Emergency Department (HOSPITAL_COMMUNITY)
Admission: EM | Admit: 2013-02-27 | Discharge: 2013-02-28 | Disposition: A | Discharge status: Home / Self Care | Payer: BC Managed Care – PPO | Attending: Emergency Medicine | Admitting: Emergency Medicine

## 2013-02-27 DIAGNOSIS — F17200 Nicotine dependence, unspecified, uncomplicated: Secondary | ICD-10-CM | POA: Insufficient documentation

## 2013-02-27 DIAGNOSIS — Y9389 Activity, other specified: Secondary | ICD-10-CM | POA: Insufficient documentation

## 2013-02-27 DIAGNOSIS — Z8659 Personal history of other mental and behavioral disorders: Secondary | ICD-10-CM | POA: Insufficient documentation

## 2013-02-27 DIAGNOSIS — R450 Nervousness: Secondary | ICD-10-CM | POA: Insufficient documentation

## 2013-02-27 DIAGNOSIS — S20211A Contusion of right front wall of thorax, initial encounter: Secondary | ICD-10-CM

## 2013-02-27 DIAGNOSIS — Z862 Personal history of diseases of the blood and blood-forming organs and certain disorders involving the immune mechanism: Secondary | ICD-10-CM | POA: Insufficient documentation

## 2013-02-27 DIAGNOSIS — S7000XA Contusion of unspecified hip, initial encounter: Secondary | ICD-10-CM | POA: Insufficient documentation

## 2013-02-27 DIAGNOSIS — S20219A Contusion of unspecified front wall of thorax, initial encounter: Secondary | ICD-10-CM | POA: Insufficient documentation

## 2013-02-27 DIAGNOSIS — R569 Unspecified convulsions: Secondary | ICD-10-CM | POA: Insufficient documentation

## 2013-02-27 DIAGNOSIS — S0003XA Contusion of scalp, initial encounter: Secondary | ICD-10-CM | POA: Insufficient documentation

## 2013-02-27 DIAGNOSIS — Y929 Unspecified place or not applicable: Secondary | ICD-10-CM | POA: Insufficient documentation

## 2013-02-27 DIAGNOSIS — S7001XA Contusion of right hip, initial encounter: Secondary | ICD-10-CM

## 2013-02-27 DIAGNOSIS — R296 Repeated falls: Secondary | ICD-10-CM | POA: Insufficient documentation

## 2013-02-27 DIAGNOSIS — S1093XA Contusion of unspecified part of neck, initial encounter: Secondary | ICD-10-CM | POA: Insufficient documentation

## 2013-02-27 NOTE — ED Notes (Signed)
Patient states she was stepping over a baby gate in a doorway and caught her foot on gate and fell.  Patient c/o headache, dizziness, right ribs and right hip pain.

## 2013-02-27 NOTE — ED Provider Notes (Signed)
History    CSN: [id number] Arrival date & time 02/27/13  2107  First MD Initiated Contact with Patient 02/27/13 2211     Chief Complaint  Patient presents with  . Fall  . Head Injury  . Hip Injury   (Consider location/radiation/quality/duration/timing/severity/associated sxs/prior Treatment) HPI Comments: Pt stumbled over a baby gate and fell. She sustained injury to the forehead, right ribs and right hip. She is unsure of LOC.  Pt denies cough, or coughing blood. She is able to stand. Pain with walking or sitting down. This accident happened about 8pm. Pt denies being on platelet altering medications.  No hx of bleeding disorders.  No operation on head, pelvis, hip,. She has not taken any medications for her discomfort. She was able to drive herself to the ED.  Patient is a 22 y.o. female presenting with fall and head injury.  Fall Pertinent negatives include no abdominal pain, arthralgias, chest pain, coughing or neck pain.  Head Injury Associated symptoms: seizures   Associated symptoms: no neck pain    Past Medical History  Diagnosis Date  . Depression   . Anxiety   . Anemia     as child  . Pseudoseizure    Past Surgical History  Procedure Laterality Date  . Tonsillectomy    . Colonoscopy  05/16/2011    Procedure: COLONOSCOPY;  Surgeon: Arlyce Harman, MD;  Location: [facility];  Service: Endoscopy;  Laterality: N/A;  12:10   Family History  Problem Relation Age of Onset  . Colon cancer Neg Hx    History  Substance Use Topics  . Smoking status: Current Every Day Smoker -- 0.50 packs/day    Types: Cigarettes  . Smokeless tobacco: Not on file  . Alcohol Use: No   OB History   Grav Para Term Preterm Abortions TAB SAB Ect Mult Living                 Review of Systems  Constitutional: Negative for activity change.       All ROS Neg except as noted in HPI  HENT: Negative for nosebleeds and neck pain.   Eyes: Negative for photophobia and discharge.    Respiratory: Negative for cough, shortness of breath and wheezing.   Cardiovascular: Negative for chest pain and palpitations.  Gastrointestinal: Negative for abdominal pain and blood in stool.  Genitourinary: Negative for dysuria, frequency and hematuria.  Musculoskeletal: Negative for back pain and arthralgias.  Skin: Negative.   Neurological: Positive for seizures. Negative for dizziness and speech difficulty.  Psychiatric/Behavioral: Negative for hallucinations and confusion. The patient is nervous/anxious.     Allergies  Demerol and Lactaid  Home Medications   Current Outpatient Rx  Name  Route  Sig  Dispense  Refill  . citalopram (CELEXA) 20 MG tablet   Oral   Take 30 mg by mouth at bedtime.          BP 127/79  Pulse 100  Temp(Src) 98.2 F (36.8 C) (Oral)  Resp 20  Ht 5\' 6"  (1.676 m)  Wt 125 lb (56.7 kg)  BMI 20.19 kg/m2  SpO2 100%  LMP 02/23/2013 Physical Exam  Nursing note and vitals reviewed. Constitutional: She is oriented to person, place, and time. She appears well-developed and well-nourished.  Non-toxic appearance.  HENT:  Head: Normocephalic.    Right Ear: Tympanic membrane and external ear normal.  Left Ear: Tympanic membrane and external ear normal.  Eyes: EOM and lids are normal. Pupils are equal, round,  and reactive to light.  Neck: Normal range of motion. Neck supple. Carotid bruit is not present.  Cardiovascular: Normal rate, regular rhythm, normal heart sounds, intact distal pulses and normal pulses.   Pulmonary/Chest: Breath sounds normal. No respiratory distress.  Abdominal: Soft. Bowel sounds are normal. There is no tenderness. There is no guarding.    Musculoskeletal: Normal range of motion.       Legs: Lymphadenopathy:       Head (right side): No submandibular adenopathy present.       Head (left side): No submandibular adenopathy present.    She has no cervical adenopathy.  Neurological: She is alert and oriented to person, place,  and time. She has normal strength. No cranial nerve deficit or sensory deficit.  Skin: Skin is warm and dry.  Psychiatric: She has a normal mood and affect. Her speech is normal.    ED Course  Procedures (including critical care time) Labs Reviewed - No data to display Dg Ribs Unilateral W/chest Right  02/27/2013   *RADIOLOGY REPORT*  Clinical Data: Fall.  Right posterior rib pain.  RIGHT RIBS AND CHEST - 3+ VIEW  Comparison: 09/10/2011  Findings: Heart and mediastinal contours are within normal limits. No focal opacities or effusions.  No acute bony abnormality.  No visible rib fractures.  No pneumothorax.  IMPRESSION: Negative.   Original Report Authenticated By: Charlett Nose, M.D.   Dg Hip Complete Right  02/27/2013   *RADIOLOGY REPORT*  Clinical Data: Fall, hip injury.  RIGHT HIP - COMPLETE 2+ VIEW  Comparison: None.  Findings: No acute bony abnormality.  Specifically, no fracture, subluxation, or dislocation.  Soft tissues are intact.  Hip joints and SI joints are symmetric and unremarkable.  IMPRESSION: No acute bony abnormality.   Original Report Authenticated By: Charlett Nose, M.D.   No diagnosis found.  MDM  *I have reviewed nursing notes, vital signs, and all appropriate lab and imaging results for this patient.** Pt fell over a baby gate  And injured theright ribs and right hip area. She c/o headache, but is not sure of LOC. CT head neg for acute problem. Right hip neg for acute abnormality. Right ribs and chest negative. Findings discussed with the patient. Pt is ambulatory with minimal problem. Rx for diclofenac and norco.   Kathie Dike, PA-C 03/01/13 1126

## 2013-02-28 MED ORDER — HYDROCODONE-ACETAMINOPHEN 5-325 MG PO TABS: 1.0000 | ORAL_TABLET | ORAL | Status: DC | PRN

## 2013-02-28 MED ORDER — HYDROCODONE-ACETAMINOPHEN 5-325 MG PO TABS
2.0000 | ORAL_TABLET | Freq: Once | ORAL | Status: AC
  Administered 2013-02-28: 2 via ORAL
  Filled 2013-02-28: qty 2

## 2013-02-28 MED ORDER — DICLOFENAC SODIUM 75 MG PO TBEC: 75.0000 mg | DELAYED_RELEASE_TABLET | Freq: Two times a day (BID) | ORAL | Status: DC

## 2013-03-01 NOTE — ED Provider Notes (Signed)
Medical screening examination/treatment/procedure(s) were performed by non-physician practitioner and as supervising physician I was immediately available for consultation/collaboration.   Karmina Zufall M Ngai Parcell, DO 03/01/13 2210 

## 2013-05-10 ENCOUNTER — Encounter (HOSPITAL_COMMUNITY): Payer: Self-pay | Admitting: Emergency Medicine

## 2013-05-10 ENCOUNTER — Emergency Department (HOSPITAL_COMMUNITY)
Admission: EM | Admit: 2013-05-10 | Discharge: 2013-05-10 | Disposition: A | Discharge status: Home / Self Care | Payer: BC Managed Care – PPO | Attending: Emergency Medicine | Admitting: Emergency Medicine

## 2013-05-10 DIAGNOSIS — R300 Dysuria: Secondary | ICD-10-CM | POA: Insufficient documentation

## 2013-05-10 DIAGNOSIS — S3720XA Unspecified injury of bladder, initial encounter: Secondary | ICD-10-CM | POA: Insufficient documentation

## 2013-05-10 DIAGNOSIS — Z3202 Encounter for pregnancy test, result negative: Secondary | ICD-10-CM | POA: Insufficient documentation

## 2013-05-10 DIAGNOSIS — IMO0002 Reserved for concepts with insufficient information to code with codable children: Secondary | ICD-10-CM | POA: Insufficient documentation

## 2013-05-10 DIAGNOSIS — N939 Abnormal uterine and vaginal bleeding, unspecified: Secondary | ICD-10-CM

## 2013-05-10 DIAGNOSIS — N898 Other specified noninflammatory disorders of vagina: Secondary | ICD-10-CM | POA: Insufficient documentation

## 2013-05-10 DIAGNOSIS — F17200 Nicotine dependence, unspecified, uncomplicated: Secondary | ICD-10-CM | POA: Insufficient documentation

## 2013-05-10 DIAGNOSIS — Y9389 Activity, other specified: Secondary | ICD-10-CM | POA: Insufficient documentation

## 2013-05-10 DIAGNOSIS — Y990 Civilian activity done for income or pay: Secondary | ICD-10-CM | POA: Insufficient documentation

## 2013-05-10 DIAGNOSIS — R319 Hematuria, unspecified: Secondary | ICD-10-CM | POA: Insufficient documentation

## 2013-05-10 DIAGNOSIS — Y929 Unspecified place or not applicable: Secondary | ICD-10-CM | POA: Insufficient documentation

## 2013-05-10 DIAGNOSIS — R42 Dizziness and giddiness: Secondary | ICD-10-CM | POA: Insufficient documentation

## 2013-05-10 DIAGNOSIS — S3730XA Unspecified injury of urethra, initial encounter: Secondary | ICD-10-CM | POA: Insufficient documentation

## 2013-05-10 DIAGNOSIS — Z862 Personal history of diseases of the blood and blood-forming organs and certain disorders involving the immune mechanism: Secondary | ICD-10-CM | POA: Insufficient documentation

## 2013-05-10 DIAGNOSIS — Z8659 Personal history of other mental and behavioral disorders: Secondary | ICD-10-CM | POA: Insufficient documentation

## 2013-05-10 LAB — BASIC METABOLIC PANEL
CO2: 26 mEq/L (ref 19–32)
Calcium: 9.9 mg/dL (ref 8.4–10.5)
Chloride: 103 mEq/L (ref 96–112)
Sodium: 138 mEq/L (ref 135–145)

## 2013-05-10 LAB — CBC WITH DIFFERENTIAL/PLATELET
Basophils Absolute: 0 10*3/uL (ref 0.0–0.1)
Eosinophils Relative: 1 % (ref 0–5)
Lymphocytes Relative: 27 % (ref 12–46)
Neutro Abs: 4.4 10*3/uL (ref 1.7–7.7)
Platelets: 239 10*3/uL (ref 150–400)
RDW: 12.5 % (ref 11.5–15.5)
WBC: 6.9 10*3/uL (ref 4.0–10.5)

## 2013-05-10 LAB — URINALYSIS, ROUTINE W REFLEX MICROSCOPIC
Leukocytes, UA: NEGATIVE
Nitrite: NEGATIVE
Protein, ur: 30 mg/dL — AB
Urobilinogen, UA: 1 mg/dL (ref 0.0–1.0)

## 2013-05-10 LAB — URINE MICROSCOPIC-ADD ON

## 2013-05-10 LAB — WET PREP, GENITAL: Clue Cells Wet Prep HPF POC: NONE SEEN

## 2013-05-10 NOTE — ED Provider Notes (Signed)
CSN: [id number]     Arrival date & time 05/10/13  1142 History   First MD Initiated Contact with Patient 05/10/13 1207     Chief Complaint  Patient presents with  . Hematuria   (Consider location/radiation/quality/duration/timing/severity/associated sxs/prior Treatment) HPI Comments: Pt is a 22 y.o. female with Pmhx as above who presents with about 1 week of dysuria/hematuria since being accidentally struck in the groin with the edge of a metal plank at work about 1 week ago.  She reports having difficulty initiating uriation.  No fever chills n/v d/a.  She has also had about 1 month of intermittent vaginal bleeding, lightheadedness.  She states she was diagnosed w/ DM about 1 month ago after having frequent seizures (has diagnosis of pseudoseizures), but only takes oral meds.    Patient is a 22 y.o. female presenting with hematuria and dysuria. The history is provided by the patient. No language interpreter was used.  Hematuria This is a new problem. The current episode started more than 2 days ago (1 week). The problem occurs constantly. The problem has been gradually worsening. Pertinent negatives include no chest pain, no abdominal pain, no headaches and no shortness of breath. Nothing aggravates the symptoms. Nothing relieves the symptoms. She has tried nothing for the symptoms. The treatment provided no relief.  Dysuria Pain quality:  Aching and burning Pain severity:  Moderate Onset quality:  Sudden Duration:  1 week Timing:  Constant Progression:  Worsening Chronicity:  New Recent urinary tract infections: no   Relieved by:  Nothing Worsened by:  Nothing tried Ineffective treatments:  None tried Urinary symptoms: frequent urination, hematuria and hesitancy   Associated symptoms: no abdominal pain, no fever, no flank pain, no genital lesions, no nausea, no vaginal discharge and no vomiting     Past Medical History  Diagnosis Date  . Depression   . Anxiety   . Anemia     as  child  . Pseudoseizure    Past Surgical History  Procedure Laterality Date  . Tonsillectomy    . Colonoscopy  05/16/2011    Procedure: COLONOSCOPY;  Surgeon: Arlyce Harman, MD;  Location: [facility];  Service: Endoscopy;  Laterality: N/A;  12:10   Family History  Problem Relation Age of Onset  . Colon cancer Neg Hx    History  Substance Use Topics  . Smoking status: Current Every Day Smoker -- 0.50 packs/day    Types: Cigarettes  . Smokeless tobacco: Not on file  . Alcohol Use: No   OB History   Grav Para Term Preterm Abortions TAB SAB Ect Mult Living                 Review of Systems  Constitutional: Negative for fever, chills, diaphoresis, activity change, appetite change and fatigue.  HENT: Negative for congestion, sore throat, facial swelling, rhinorrhea, neck pain and neck stiffness.   Eyes: Negative for photophobia and discharge.  Respiratory: Negative for cough, chest tightness and shortness of breath.   Cardiovascular: Negative for chest pain, palpitations and leg swelling.  Gastrointestinal: Negative for nausea, vomiting, abdominal pain and diarrhea.  Endocrine: Negative for polydipsia and polyuria.  Genitourinary: Positive for dysuria and hematuria. Negative for frequency, flank pain, vaginal discharge, difficulty urinating and pelvic pain.  Musculoskeletal: Negative for back pain and arthralgias.  Skin: Negative for color change and wound.  Allergic/Immunologic: Negative for immunocompromised state.  Neurological: Negative for facial asymmetry, weakness, numbness and headaches.  Hematological: Does not bruise/bleed easily.  Psychiatric/Behavioral:  Negative for confusion and agitation.    Allergies  Demerol and Lactaid  Home Medications  No current outpatient prescriptions on file. BP 133/78  Pulse 87  Temp(Src) 98.2 F (36.8 C) (Oral)  Resp 14  SpO2 100% Physical Exam  Constitutional: She is oriented to person, place, and time. She appears  well-developed and well-nourished. No distress.  HENT:  Head: Normocephalic and atraumatic.  Mouth/Throat: No oropharyngeal exudate.  Eyes: Pupils are equal, round, and reactive to light.  Neck: Normal range of motion. Neck supple.  Cardiovascular: Normal rate, regular rhythm and normal heart sounds.  Exam reveals no gallop and no friction rub.   No murmur heard. Pulmonary/Chest: Effort normal and breath sounds normal. No respiratory distress. She has no wheezes. She has no rales.  Abdominal: Soft. Bowel sounds are normal. She exhibits no distension and no mass. There is no tenderness. There is no rebound and no guarding.  Genitourinary:  Mild periurethral edema w/o laceration.  Small amt darm blood from cervical os.  No vaginal lesion or laceration  Musculoskeletal: Normal range of motion. She exhibits no edema and no tenderness.  Neurological: She is alert and oriented to person, place, and time.  Skin: Skin is warm and dry.  Psychiatric: She has a normal mood and affect.    ED Course  Procedures (including critical care time) Labs Review Labs Reviewed  WET PREP, GENITAL - Abnormal; Notable for the following:    WBC, Wet Prep HPF POC FEW (*)    All other components within normal limits  URINALYSIS, ROUTINE W REFLEX MICROSCOPIC - Abnormal; Notable for the following:    Color, Urine AMBER (*)    APPearance CLOUDY (*)    Hgb urine dipstick LARGE (*)    Bilirubin Urine SMALL (*)    Protein, ur 30 (*)    All other components within normal limits  BASIC METABOLIC PANEL - Abnormal; Notable for the following:    Glucose, Bld 100 (*)    All other components within normal limits  URINE MICROSCOPIC-ADD ON - Abnormal; Notable for the following:    Squamous Epithelial / LPF FEW (*)    Bacteria, UA FEW (*)    All other components within normal limits  GC/CHLAMYDIA PROBE AMP  CBC WITH DIFFERENTIAL  URINALYSIS, ROUTINE W REFLEX MICROSCOPIC   Imaging Review No results found.  MDM    1. Hematuria   2. Vaginal bleeding   3. Urethral injury, closed, initial encounter    Pt is a 22 y.o. female with Pmhx as above who presents with about 1 week of dysuria/hematuria since being accidentally struck in the groin with the edge of a metal plank at work about 1 week ago.  She reports having difficulty initiating uriation.  No fever chills n/v d/a.  She has also had about 1 month of intermittent vaginal bleeding, lightheadedness.  She states she was diagnosed w/ DM about 1 month ago after having frequent seizures (has diagnosis of pseudoseizures), but only takes oral meds.   Clean catch UA w/ blood but does not appear infected.  Hb stable.  Mild periurethral edema on exam w/ small amt blood from cervix. Cath specimen taken, but pt did not have enough to run UA.  I am unsure if blood pt reports truly hematuria or is vaginal.  Have requested she f/u with her GYN for abnormal vaginal bleeding.  She would like to go to health dept for depo shot rather than starting OCPs.  If hematuria continues past  vaginal bleeding, I have asked her to f/u with urology.  Return precautions given for new or worsening symptoms including worsening pain, fever, n/v, inability to tolerate PO.   1. Hematuria   2. Vaginal bleeding   3. Urethral injury, closed, initial encounter         Shanna Cisco, MD 05/10/13 [phone]

## 2013-05-10 NOTE — ED Notes (Signed)
Pt states she has been having dysuria x 1 wk.  States that she is in a collision repair class and got "hit" in between the legs with a sharp metal panel in her "uvula..you know, the thing you pee out of".  Assuming patient means urethra.  States that this happened last week and she hasn't been peeing the same since.

## 2013-05-10 NOTE — ED Notes (Signed)
Spoke with mini lab. POC preg negative

## 2013-05-11 LAB — GC/CHLAMYDIA PROBE AMP
CT Probe RNA: NEGATIVE
GC Probe RNA: NEGATIVE

## 2013-05-12 LAB — POCT PREGNANCY, URINE
Preg Test, Ur: NEGATIVE
Preg Test, Ur: NEGATIVE

## 2013-05-28 ENCOUNTER — Emergency Department (HOSPITAL_COMMUNITY): Payer: BC Managed Care – PPO

## 2013-05-28 ENCOUNTER — Emergency Department (HOSPITAL_COMMUNITY)
Admission: EM | Admit: 2013-05-28 | Discharge: 2013-05-28 | Disposition: A | Discharge status: Home / Self Care | Payer: BC Managed Care – PPO | Attending: Emergency Medicine | Admitting: Emergency Medicine

## 2013-05-28 ENCOUNTER — Encounter (HOSPITAL_COMMUNITY): Payer: Self-pay | Admitting: Emergency Medicine

## 2013-05-28 DIAGNOSIS — R7309 Other abnormal glucose: Secondary | ICD-10-CM | POA: Insufficient documentation

## 2013-05-28 DIAGNOSIS — R51 Headache: Secondary | ICD-10-CM | POA: Insufficient documentation

## 2013-05-28 DIAGNOSIS — F17200 Nicotine dependence, unspecified, uncomplicated: Secondary | ICD-10-CM | POA: Insufficient documentation

## 2013-05-28 DIAGNOSIS — H53149 Visual discomfort, unspecified: Secondary | ICD-10-CM | POA: Insufficient documentation

## 2013-05-28 DIAGNOSIS — Z862 Personal history of diseases of the blood and blood-forming organs and certain disorders involving the immune mechanism: Secondary | ICD-10-CM | POA: Insufficient documentation

## 2013-05-28 DIAGNOSIS — Z8659 Personal history of other mental and behavioral disorders: Secondary | ICD-10-CM | POA: Insufficient documentation

## 2013-05-28 DIAGNOSIS — R110 Nausea: Secondary | ICD-10-CM | POA: Insufficient documentation

## 2013-05-28 DIAGNOSIS — Z8669 Personal history of other diseases of the nervous system and sense organs: Secondary | ICD-10-CM | POA: Insufficient documentation

## 2013-05-28 DIAGNOSIS — R5381 Other malaise: Secondary | ICD-10-CM | POA: Insufficient documentation

## 2013-05-28 DIAGNOSIS — Z3202 Encounter for pregnancy test, result negative: Secondary | ICD-10-CM | POA: Insufficient documentation

## 2013-05-28 DIAGNOSIS — R42 Dizziness and giddiness: Secondary | ICD-10-CM | POA: Insufficient documentation

## 2013-05-28 LAB — URINALYSIS, ROUTINE W REFLEX MICROSCOPIC
Hgb urine dipstick: NEGATIVE
Ketones, ur: NEGATIVE mg/dL
Nitrite: NEGATIVE
Protein, ur: NEGATIVE mg/dL
Urobilinogen, UA: 0.2 mg/dL (ref 0.0–1.0)

## 2013-05-28 LAB — POCT PREGNANCY, URINE: Preg Test, Ur: NEGATIVE

## 2013-05-28 LAB — POCT I-STAT, CHEM 8
BUN: 8 mg/dL (ref 6–23)
Calcium, Ion: 1.24 mmol/L — ABNORMAL HIGH (ref 1.12–1.23)
Creatinine, Ser: 0.8 mg/dL (ref 0.50–1.10)
Glucose, Bld: 85 mg/dL (ref 70–99)
Hemoglobin: 12.9 g/dL (ref 12.0–15.0)
TCO2: 25 mmol/L (ref 0–100)

## 2013-05-28 MED ORDER — KETOROLAC TROMETHAMINE 30 MG/ML IJ SOLN
30.0000 mg | Freq: Once | INTRAMUSCULAR | Status: AC
  Administered 2013-05-28: 30 mg via INTRAVENOUS
  Filled 2013-05-28: qty 1

## 2013-05-28 MED ORDER — SODIUM CHLORIDE 0.9 % IV BOLUS (SEPSIS)
1000.0000 mL | Freq: Once | INTRAVENOUS | Status: AC
  Administered 2013-05-28: 1000 mL via INTRAVENOUS

## 2013-05-28 NOTE — ED Notes (Signed)
Pt c/o dizziness, headache and weakness starting this morning.  Pain score 8/10.  Pt thought her blood sugar was elevated.  CBG 88 on arrival.

## 2013-05-28 NOTE — ED Provider Notes (Signed)
CSN: [id number]     Arrival date & time 05/28/13  1735 History   First MD Initiated Contact with Patient 05/28/13 2007     Chief Complaint  Patient presents with  . Dizziness  . Weakness  . Hyperglycemia   (Consider location/radiation/quality/duration/timing/severity/associated sxs/prior Treatment) HPI Comments: Pt states she was diagnosed by alternative medicine clinic about 3 months ago w/ diabetes, was put on natural metformin supplement but still having labile glucose 80-150's.   Patient is a 23 y.o. female presenting with headaches. The history is provided by the patient. No language interpreter was used.  Headache Pain location:  Frontal Radiates to:  Does not radiate Pain severity now: moderate. Pain scale at highest: moderate. Duration: 3 months. Timing:  Intermittent Progression:  Waxing and waning Chronicity:  Chronic Similar to prior headaches: yes   Context comment:  When she feels her blood sugar is now Relieved by: eating. Worsened by:  Light Ineffective treatments:  None tried Associated symptoms: dizziness, fatigue, nausea and photophobia   Associated symptoms: no abdominal pain, no back pain, no congestion, no cough, no diarrhea, no fever, no near-syncope, no neck pain, no neck stiffness, no numbness, no paresthesias, no sore throat and no vomiting     Past Medical History  Diagnosis Date  . Depression   . Anxiety   . Anemia     as child  . Pseudoseizure    Past Surgical History  Procedure Laterality Date  . Tonsillectomy    . Colonoscopy  05/16/2011    Procedure: COLONOSCOPY;  Surgeon: Arlyce Harman, MD;  Location: [facility];  Service: Endoscopy;  Laterality: N/A;  12:10   Family History  Problem Relation Age of Onset  . Colon cancer Neg Hx    History  Substance Use Topics  . Smoking status: Current Every Day Smoker -- 1.50 packs/day    Types: Cigarettes  . Smokeless tobacco: Never Used  . Alcohol Use: Yes     Comment: occ   OB History   Grav Para Term Preterm Abortions TAB SAB Ect Mult Living                 Review of Systems  Constitutional: Positive for fatigue. Negative for fever, chills, diaphoresis, activity change and appetite change.  HENT: Negative for congestion, facial swelling, rhinorrhea and sore throat.   Eyes: Positive for photophobia. Negative for discharge.  Respiratory: Negative for cough, chest tightness and shortness of breath.   Cardiovascular: Negative for chest pain, palpitations, leg swelling and near-syncope.  Gastrointestinal: Positive for nausea. Negative for vomiting, abdominal pain and diarrhea.  Endocrine: Negative for polydipsia and polyuria.  Genitourinary: Negative for dysuria, frequency, difficulty urinating and pelvic pain.  Musculoskeletal: Negative for arthralgias, back pain, neck pain and neck stiffness.  Skin: Negative for color change and wound.  Allergic/Immunologic: Negative for immunocompromised state.  Neurological: Positive for dizziness and headaches. Negative for facial asymmetry, weakness, numbness and paresthesias.  Hematological: Does not bruise/bleed easily.  Psychiatric/Behavioral: Negative for confusion and agitation.    Allergies  Demerol and Lactaid  Home Medications  No current outpatient prescriptions on file. BP 113/67  Pulse 75  Temp(Src) 98.4 F (36.9 C) (Oral)  Resp 18  SpO2 97%  LMP 05/10/2013 Physical Exam  Constitutional: She is oriented to person, place, and time. She appears well-developed and well-nourished. No distress.  HENT:  Head: Normocephalic and atraumatic.  Mouth/Throat: No oropharyngeal exudate.  Eyes: Pupils are equal, round, and reactive to light.  Neck: Normal  range of motion. Neck supple.  Cardiovascular: Normal rate, regular rhythm and normal heart sounds.  Exam reveals no gallop and no friction rub.   No murmur heard. Pulmonary/Chest: Effort normal and breath sounds normal. No respiratory distress. She has no wheezes. She has  no rales.  Abdominal: Soft. Bowel sounds are normal. She exhibits no distension and no mass. There is no tenderness. There is no rebound and no guarding.  Musculoskeletal: Normal range of motion. She exhibits no edema and no tenderness.  Neurological: She is alert and oriented to person, place, and time. She has normal strength. She displays no tremor. No cranial nerve deficit or sensory deficit. She exhibits normal muscle tone. She displays a negative Romberg sign. Coordination and gait normal. GCS eye subscore is 4. GCS verbal subscore is 5. GCS motor subscore is 6.  Skin: Skin is warm and dry.  Psychiatric: She has a normal mood and affect.    ED Course  Procedures (including critical care time) Labs Review Labs Reviewed  POCT I-STAT, CHEM 8 - Abnormal; Notable for the following:    Calcium, Ion 1.24 (*)    All other components within normal limits  GLUCOSE, CAPILLARY  URINALYSIS, ROUTINE W REFLEX MICROSCOPIC  POCT PREGNANCY, URINE   Imaging Review Dg Chest 2 View  05/28/2013   CLINICAL DATA:  Dizziness. Weakness. Cough.  EXAM: CHEST  2 VIEW  COMPARISON:  02/27/2013  FINDINGS: Normal heart size. Lungs are hyper aerated and clear. No pneumothorax. Mild bronchitic changes.  IMPRESSION: Hyper aeration and bronchitic changes.   Electronically Signed   By: Maryclare Bean M.D.   On: 05/28/2013 21:52    MDM   1. Headache   2. Labile blood glucose   fluctuating blood sugars, headaches, lightheadedness for 3 months. Pt has been taking an herbal supplement that she says is similar to metformin which has not been helping.  FSBG 80-150s when taken at home.  H/a's occur daily for 3 months, are occipital, worse w/ bright light.  No numbness, weakness, fevers, but has had cough for about 2 weeks.  FSBG here 88 and states she has not eaten since 2pm.   Neuro exam unremarkable. istat chem 8, UA, poc preg unremarkable.  I am not convinced by has DM, seems to have been getting medical care from a alternative  medicine clinic. Will refer for local PCP.  She can f/u with her neurologist for headaches. Doubt meningitis, SAH, tumor. Have asked her to stop supplements.          Shanna Cisco, MD 05/29/13 [phone]

## 2013-07-13 ENCOUNTER — Emergency Department (HOSPITAL_COMMUNITY): Payer: BC Managed Care – PPO

## 2013-07-13 ENCOUNTER — Encounter (HOSPITAL_COMMUNITY): Payer: Self-pay | Admitting: Emergency Medicine

## 2013-07-13 ENCOUNTER — Emergency Department (HOSPITAL_COMMUNITY)
Admission: EM | Admit: 2013-07-13 | Discharge: 2013-07-13 | Disposition: A | Discharge status: Home / Self Care | Payer: BC Managed Care – PPO | Attending: Emergency Medicine | Admitting: Emergency Medicine

## 2013-07-13 DIAGNOSIS — R55 Syncope and collapse: Secondary | ICD-10-CM

## 2013-07-13 DIAGNOSIS — Z3202 Encounter for pregnancy test, result negative: Secondary | ICD-10-CM | POA: Insufficient documentation

## 2013-07-13 DIAGNOSIS — S298XXA Other specified injuries of thorax, initial encounter: Secondary | ICD-10-CM | POA: Insufficient documentation

## 2013-07-13 DIAGNOSIS — E119 Type 2 diabetes mellitus without complications: Secondary | ICD-10-CM | POA: Insufficient documentation

## 2013-07-13 DIAGNOSIS — F17200 Nicotine dependence, unspecified, uncomplicated: Secondary | ICD-10-CM | POA: Insufficient documentation

## 2013-07-13 DIAGNOSIS — Z862 Personal history of diseases of the blood and blood-forming organs and certain disorders involving the immune mechanism: Secondary | ICD-10-CM | POA: Insufficient documentation

## 2013-07-13 DIAGNOSIS — W1809XA Striking against other object with subsequent fall, initial encounter: Secondary | ICD-10-CM | POA: Insufficient documentation

## 2013-07-13 DIAGNOSIS — F29 Unspecified psychosis not due to a substance or known physiological condition: Secondary | ICD-10-CM | POA: Insufficient documentation

## 2013-07-13 DIAGNOSIS — R413 Other amnesia: Secondary | ICD-10-CM | POA: Insufficient documentation

## 2013-07-13 DIAGNOSIS — R42 Dizziness and giddiness: Secondary | ICD-10-CM | POA: Insufficient documentation

## 2013-07-13 DIAGNOSIS — S0990XA Unspecified injury of head, initial encounter: Secondary | ICD-10-CM | POA: Insufficient documentation

## 2013-07-13 DIAGNOSIS — Y9389 Activity, other specified: Secondary | ICD-10-CM | POA: Insufficient documentation

## 2013-07-13 DIAGNOSIS — S3981XA Other specified injuries of abdomen, initial encounter: Secondary | ICD-10-CM | POA: Insufficient documentation

## 2013-07-13 DIAGNOSIS — Y92009 Unspecified place in unspecified non-institutional (private) residence as the place of occurrence of the external cause: Secondary | ICD-10-CM | POA: Insufficient documentation

## 2013-07-13 LAB — URINE MICROSCOPIC-ADD ON

## 2013-07-13 LAB — URINALYSIS, ROUTINE W REFLEX MICROSCOPIC
Bilirubin Urine: NEGATIVE
Glucose, UA: NEGATIVE mg/dL
Ketones, ur: NEGATIVE mg/dL
Protein, ur: NEGATIVE mg/dL
pH: 5.5 (ref 5.0–8.0)

## 2013-07-13 MED ORDER — OXYCODONE-ACETAMINOPHEN 5-325 MG PO TABS
1.0000 | ORAL_TABLET | Freq: Once | ORAL | Status: AC
  Administered 2013-07-13: 1 via ORAL
  Filled 2013-07-13: qty 1

## 2013-07-13 NOTE — ED Notes (Signed)
Discharge instructions reviewed with pt, questions answered. Pt verbalized understanding.  

## 2013-07-13 NOTE — ED Notes (Signed)
Pt tearful, convinced to receive treatment, pt upset because she felt like no one believes her seizures are real, pt stutters but easily understood, pt on monitor at this time and an EKG performed.

## 2013-07-13 NOTE — ED Provider Notes (Signed)
CSN: [id number]     Arrival date & time 07/13/13  1459 History  This chart was scribed for Joya Gaskins, MD by Quintella Reichert, ED scribe.  This patient was seen in room [facility] and the patient's care was started at 3:16 PM.   Chief Complaint  Patient presents with  . Seizures    Patient is a 22 y.o. female presenting with seizures. The history is provided by the patient and a significant other.  Seizures Seizure activity on arrival: no   Seizure type:  Unable to specify Preceding symptoms: dizziness   Initial focality:  Unable to specify Episode characteristics: confusion   Postictal symptoms: confusion and memory loss   Progression:  Resolved History of seizures: yes     HPI Comments: Kayla Hardy is a 22 y.o. female with h/o DM who presents to the Emergency Department complaining of a seizure that occurred pta.  Pt does not remember the seizure.  She states that she remembers feeling very dizzy while standing up and the next thing she remembers she was on the floor.  Boyfriend states he was outside when it happened and the episode was witnessed by his brother who is not present on initial evaluation to provide further description.  Boyfriend states that he heard her fall and by the time he got inside her eyes were open.  He did not see her jerking.  She seemed very confused when he saw her and had difficulty talking.  He states that his brother told him that she hit the back of her head on the stove and she now complains of moderate pain to that area.  She also complains of chest pain and mild abdominal pain.  She denies fevers, vomiting, or diarrhea.  Pt admits to prior h/o seizures but does not take anything for them.  She was told she has DM and this was the cause of seizures.  She denies illicit drug use.   Past Medical History  Diagnosis Date  . Depression   . Anxiety   . Anemia     as child  . Pseudoseizure   . Diabetes mellitus without complication      Past Surgical History  Procedure Laterality Date  . Tonsillectomy    . Colonoscopy  05/16/2011    Procedure: COLONOSCOPY;  Surgeon: Arlyce Harman, MD;  Location: [facility];  Service: Endoscopy;  Laterality: N/A;  12:10    Family History  Problem Relation Age of Onset  . Colon cancer Neg Hx     History  Substance Use Topics  . Smoking status: Current Every Day Smoker -- 1.50 packs/day    Types: Cigarettes  . Smokeless tobacco: Never Used  . Alcohol Use: Yes     Comment: occ   OB History   Grav Para Term Preterm Abortions TAB SAB Ect Mult Living                  Review of Systems  Constitutional: Negative for fever.  HENT:       Posterior head pain  Cardiovascular: Positive for chest pain.  Gastrointestinal: Positive for abdominal pain. Negative for vomiting and diarrhea.  Neurological: Positive for dizziness and seizures.  Psychiatric/Behavioral: Positive for confusion.  All other systems reviewed and are negative.     Allergies  Demerol and Lactaid  Home Medications  No current outpatient prescriptions on file.  BP 112/71  Pulse 75  Resp 16  SpO2 100%  LMP 06/19/2013   Physical  Exam  Nursing note and vitals reviewed. BP 112/71  Pulse 75  Resp 16  SpO2 100%  LMP 06/19/2013  CONSTITUTIONAL: Well developed/well nourished HEAD: Normocephalic/atraumatic.  Mild tenderness to occipital region.  No step-offs.  No large hematoma noted.   EYES: EOMI/PERRL ENMT: Mucous membranes moist.  No evidence of facial/nasal trauma NECK: supple no meningeal signs SPINE:entire spine nontender CV: S1/S2 noted, no murmurs/rubs/gallops noted LUNGS: Lungs are clear to auscultation bilaterally, no apparent distress ABDOMEN: soft, nontender, no rebound or guarding GU:no cva tenderness NEURO: Pt is awake/alert, moves all extremitiesx4.  No arms or leg drift.  No facial droop.  No aphasia.  No dysarthria.  Pt is stuttering. EXTREMITIES: pulses normal, full ROM, no  deformity, full ROM of all extremities SKIN: warm, color normal PSYCH: no abnormalities of mood noted   ED Course  Procedures (including critical care time)     COORDINATION OF CARE: 3:22 PM-Discussed treatment plan which includes labs and EKG with pt at bedside and pt agreed to plan.    3:57 PM Pt reporting worsening headache and dizziness She also now has midline cspine tenderness Will get imaging Other family reports that pt had eyes "rolling" in her head and fell and hit her head.  No active seizures noted No other tenderness/trauma noted to extremities 5:31 PM Pt improved She is ambulatory without ataxia No speech difficulty noted, no stuttering noted   Labs Review Labs Reviewed  URINALYSIS, ROUTINE W REFLEX MICROSCOPIC - Abnormal; Notable for the following:    Specific Gravity, Urine >1.030 (*)    Hgb urine dipstick SMALL (*)    All other components within normal limits  URINE MICROSCOPIC-ADD ON - Abnormal; Notable for the following:    Bacteria, UA MANY (*)    All other components within normal limits  URINE CULTURE  GLUCOSE, CAPILLARY  POCT PREGNANCY, URINE    Imaging Review Dg Cervical Spine Complete  07/13/2013   CLINICAL DATA:  Posterior neck pain.  EXAM: CERVICAL SPINE  4+ VIEWS  COMPARISON:  02/27/2013  FINDINGS: There is loss of normal cervical lordosis. Disc spaces are maintained. Prevertebral soft tissues are normal. No fracture or subluxation.  IMPRESSION: Loss of cervical lordosis which may be related to muscle spasm. No bony abnormality.   Electronically Signed   By: Charlett Nose M.D.   On: 07/13/2013 17:21   Ct Head Wo Contrast  07/13/2013   CLINICAL DATA:  Seizure.  Altered level consciousness.  EXAM: CT HEAD WITHOUT CONTRAST  TECHNIQUE: Contiguous axial images were obtained from the base of the skull through the vertex without intravenous contrast.  COMPARISON:  02/27/2013  FINDINGS: No evidence of intracranial hemorrhage, brain edema, or other  signs of acute infarction. No evidence of intracranial mass lesion or mass effect. No abnormal extraaxial fluid collections identified. Ventricles are normal in size. No skull abnormality identified.  IMPRESSION: Negative noncontrast head CT.   Electronically Signed   By: Myles Rosenthal M.D.   On: 07/13/2013 17:21     EKG Interpretation    Date/Time:  Sunday July 13 2013 15:03:57 EST Ventricular Rate:  69 PR Interval:  140 QRS Duration: 88 QT Interval:  386 QTC Calculation: 413 R Axis:   79 Text Interpretation:  Normal sinus rhythm Normal ECG No previous ECGs available Confirmed by Bebe Shaggy  MD, Jossette Zirbel [id number]) on 07/13/2013 3:29:58 PM            MDM  No diagnosis found. Nursing notes including past medical history and social history  reviewed and considered in documentation Labs/vital reviewed and considered xrays reviewed and considered     I personally performed the services described in this documentation, which was scribed in my presence. The recorded information has been reviewed and is accurate.      Joya Gaskins, MD 07/13/13 [phone]

## 2013-07-13 NOTE — ED Notes (Signed)
Pt wants to wait until she can eat to take pain med, fear of making her sick.

## 2013-07-13 NOTE — ED Notes (Signed)
Pt had a seizure while at home. Fell while standing beside of kitchen table. Family reported seizure-like activity. EMS states pt had similar episode while en route and was easily aroused with an ammonia inhalant. Pt is upset and crying on arrival, refusing to put on gown or give any information. Pt has not taken any of her medications x 3 weeks, family states she has not went back to health department to receive medications.

## 2013-07-13 NOTE — ED Notes (Signed)
Pt states that physician has never placed her on seizure medication and her diabetes is diet controlled.

## 2013-07-13 NOTE — ED Notes (Signed)
Pt given soda and crackers so she could take pain med.

## 2013-07-14 LAB — URINE CULTURE
Colony Count: NO GROWTH
Culture: NO GROWTH

## 2013-11-12 ENCOUNTER — Encounter (HOSPITAL_COMMUNITY): Payer: Self-pay | Admitting: Emergency Medicine

## 2013-11-12 ENCOUNTER — Emergency Department (HOSPITAL_COMMUNITY)
Admission: EM | Admit: 2013-11-12 | Discharge: 2013-11-13 | Disposition: A | Discharge status: Home / Self Care | Payer: BC Managed Care – PPO | Attending: Emergency Medicine | Admitting: Emergency Medicine

## 2013-11-12 DIAGNOSIS — R51 Headache: Secondary | ICD-10-CM | POA: Insufficient documentation

## 2013-11-12 DIAGNOSIS — Z79899 Other long term (current) drug therapy: Secondary | ICD-10-CM | POA: Insufficient documentation

## 2013-11-12 DIAGNOSIS — F17200 Nicotine dependence, unspecified, uncomplicated: Secondary | ICD-10-CM | POA: Insufficient documentation

## 2013-11-12 DIAGNOSIS — F445 Conversion disorder with seizures or convulsions: Secondary | ICD-10-CM

## 2013-11-12 DIAGNOSIS — Z8659 Personal history of other mental and behavioral disorders: Secondary | ICD-10-CM | POA: Insufficient documentation

## 2013-11-12 DIAGNOSIS — R569 Unspecified convulsions: Secondary | ICD-10-CM | POA: Insufficient documentation

## 2013-11-12 DIAGNOSIS — R519 Headache, unspecified: Secondary | ICD-10-CM

## 2013-11-12 DIAGNOSIS — Z862 Personal history of diseases of the blood and blood-forming organs and certain disorders involving the immune mechanism: Secondary | ICD-10-CM | POA: Insufficient documentation

## 2013-11-12 LAB — CBC WITH DIFFERENTIAL/PLATELET
BASOS ABS: 0.1 10*3/uL (ref 0.0–0.1)
Basophils Relative: 1 % (ref 0–1)
Eosinophils Absolute: 0.2 10*3/uL (ref 0.0–0.7)
Eosinophils Relative: 3 % (ref 0–5)
HEMATOCRIT: 34 % — AB (ref 36.0–46.0)
HEMOGLOBIN: 11.8 g/dL — AB (ref 12.0–15.0)
LYMPHS PCT: 42 % (ref 12–46)
Lymphs Abs: 2.9 10*3/uL (ref 0.7–4.0)
MCH: 32.9 pg (ref 26.0–34.0)
MCHC: 34.7 g/dL (ref 30.0–36.0)
MCV: 94.7 fL (ref 78.0–100.0)
MONO ABS: 0.5 10*3/uL (ref 0.1–1.0)
Monocytes Relative: 7 % (ref 3–12)
Neutro Abs: 3.3 10*3/uL (ref 1.7–7.7)
Neutrophils Relative %: 48 % (ref 43–77)
Platelets: 247 10*3/uL (ref 150–400)
RBC: 3.59 MIL/uL — ABNORMAL LOW (ref 3.87–5.11)
RDW: 11.8 % (ref 11.5–15.5)
WBC: 7 10*3/uL (ref 4.0–10.5)

## 2013-11-12 LAB — MAGNESIUM: Magnesium: 2.1 mg/dL (ref 1.5–2.5)

## 2013-11-12 LAB — COMPREHENSIVE METABOLIC PANEL
ALT: 6 U/L (ref 0–35)
AST: 13 U/L (ref 0–37)
Albumin: 4 g/dL (ref 3.5–5.2)
Alkaline Phosphatase: 53 U/L (ref 39–117)
BUN: 15 mg/dL (ref 6–23)
CO2: 25 mEq/L (ref 19–32)
CREATININE: 0.8 mg/dL (ref 0.50–1.10)
Calcium: 9.6 mg/dL (ref 8.4–10.5)
Chloride: 105 mEq/L (ref 96–112)
GFR calc Af Amer: >90 mL/min (ref >90)
GFR calc non Af Amer: >90 mL/min (ref >90)
GLUCOSE: 107 mg/dL — AB (ref 70–99)
Potassium: 3.5 mEq/L — ABNORMAL LOW (ref 3.7–5.3)
Sodium: 141 mEq/L (ref 137–147)
Total Bilirubin: <0.2 mg/dL — ABNORMAL LOW (ref 0.3–1.2)
Total Protein: 6.7 g/dL (ref 6.0–8.3)

## 2013-11-12 MED ORDER — SODIUM CHLORIDE 0.9 % IV BOLUS (SEPSIS)
1000.0000 mL | Freq: Once | INTRAVENOUS | Status: AC
  Administered 2013-11-13: 1000 mL via INTRAVENOUS

## 2013-11-12 MED ORDER — METOCLOPRAMIDE HCL 5 MG/ML IJ SOLN
10.0000 mg | Freq: Once | INTRAMUSCULAR | Status: AC
  Administered 2013-11-13: 10 mg via INTRAVENOUS
  Filled 2013-11-12: qty 2

## 2013-11-12 MED ORDER — ONDANSETRON HCL 4 MG/2ML IJ SOLN
4.0000 mg | Freq: Once | INTRAMUSCULAR | Status: AC
  Administered 2013-11-13: 4 mg via INTRAVENOUS
  Filled 2013-11-12: qty 2

## 2013-11-12 MED ORDER — DIPHENHYDRAMINE HCL 50 MG/ML IJ SOLN
25.0000 mg | Freq: Once | INTRAMUSCULAR | Status: AC
  Administered 2013-11-13: 25 mg via INTRAVENOUS
  Filled 2013-11-12: qty 1

## 2013-11-12 NOTE — ED Provider Notes (Signed)
CSN: [id number]     Arrival date & time 11/12/13  2019 History   First MD Initiated Contact with Patient 11/12/13 2129     Chief Complaint  Patient presents with  . Seizures     (Consider location/radiation/quality/duration/timing/severity/associated sxs/prior Treatment) HPI Patient states she has daily headaches. 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 She states she's had CT scans, MRI scans, EEGs that are all normal. When asked what her diagnosis was she states "they tell me crazy" however when I have her tell me exactly what they say they tell her she needs a psychiatric evaluation. Patient states yesterday she had acute onset of severe headache that is located posteriorly. She states it throbs it comes forward to the front of her head. The headache has continued throughout today. She has had nausea without vomiting. She has blurred vision. She states both her legs feel weak. Her boyfriend states today they were in class about 8 PM and patient started saying she felt bad. They had her sit down. Her face was red. She started having shaking episodes and then would be unresponsive for 2-3 minutes. He states she had unresponsive episodes at least twice at school and then twice in the car coming to the ED. He states she also complained of her face feeling numb however she does not recall that now. She states she had her last episode in November. She states she's been seen by 6 neurologist. She has an appointment in May at [facility] for further evaluation. She states she has never been placed on seizure medication. She states the headache she has today is the worst headache she's ever had. She denies being under any extra stress.  PCP PA Lore[address] Appt at [facility] in May  Past Medical History  Diagnosis Date  . Depression   . Anxiety   . Anemia     as child  . Pseudoseizure    Past Surgical History  Procedure Laterality Date  . Tonsillectomy    . Colonoscopy  05/16/2011     Procedure: COLONOSCOPY;  Surgeon: Arlyce Harman, MD;  [facility]: [facility];  Service: Endoscopy;  Laterality: N/A;  12:10   Family History  Problem Relation Age of Onset  . Colon cancer Neg Hx    History  Substance Use Topics  . Smoking status: Current Every Day Smoker -- 1.50 packs/day    Types: Cigarettes  . Smokeless tobacco: Never Used  . Alcohol Use: Yes     Comment: occ   Student employed  OB History   Grav Para Term Preterm Abortions TAB SAB Ect Mult Living                 Review of Systems  All other systems reviewed and are negative.      Allergies  Demerol and Lactaid  Home Medications   Current Outpatient Rx  Name  Route  Sig  Dispense  Refill  . aspirin-acetaminophen-caffeine (EXCEDRIN MIGRAINE) 250-250-65 MG per tablet   Oral   Take 2 tablets by mouth every 6 (six) hours as needed for headache.         . ibuprofen (ADVIL,MOTRIN) 200 MG tablet   Oral   Take 800 mg by mouth every 6 (six) hours as needed for moderate pain.         . Multiple Vitamin (MULTIVITAMIN WITH MINERALS) TABS tablet   Oral   Take 1 tablet by mouth daily.  BP 113/95  Pulse 91  Temp(Src) 98.6 F (37 C) (Oral)  Resp 20  SpO2 100%  LMP 11/05/2013  Vital signs normal    Physical Exam  Nursing note and vitals reviewed. Constitutional: She is oriented to person, place, and time. She appears well-developed and well-nourished.  Non-toxic appearance. She does not appear ill. No distress.  HENT:  Head: Normocephalic and atraumatic.  Right Ear: External ear normal.  Left Ear: External ear normal.  Nose: Nose normal. No mucosal edema or rhinorrhea.  Mouth/Throat: Oropharynx is clear and moist and mucous membranes are normal. No dental abscesses or uvula swelling.  Eyes: Conjunctivae and EOM are normal. Pupils are equal, round, and reactive to light.  Neck: Normal range of motion and full passive range of motion without pain. Neck supple.  Cardiovascular:  Normal rate, regular rhythm and normal heart sounds.  Exam reveals no gallop and no friction rub.   No murmur heard. Pulmonary/Chest: Effort normal and breath sounds normal. No respiratory distress. She has no wheezes. She has no rhonchi. She has no rales. She exhibits no tenderness and no crepitus.  Abdominal: Soft. Normal appearance and bowel sounds are normal. She exhibits no distension. There is no tenderness. There is no rebound and no guarding.  Musculoskeletal: Normal range of motion. She exhibits no edema and no tenderness.  Moves all extremities well.   Neurological: She is alert and oriented to person, place, and time. She has normal strength. No cranial nerve deficit.  Skin: Skin is warm, dry and intact. No rash noted. No erythema. No pallor.  Psychiatric: She has a normal mood and affect. Her speech is normal and behavior is normal. Her mood appears not anxious.  Patient speaks in a stuttering pattern although at times she can speak clearly. Her boyfriend states this is not her normal speech.    ED Course  Procedures (including critical care time) Medications  sodium chloride 0.9 % bolus 1,000 mL (0 mLs Intravenous Stopped 11/13/13 0127)  metoCLOPramide (REGLAN) injection 10 mg (10 mg Intravenous Given 11/13/13 0010)  diphenhydrAMINE (BENADRYL) injection 25 mg (25 mg Intravenous Given 11/13/13 0010)  ondansetron (ZOFRAN) injection 4 mg (4 mg Intravenous Given 11/13/13 0010)    00:25 pt just received her medications, CT head pending. Turned over to Dr Anitra LauthPlunkett at change of shift to get CT results and reassess her headache.   Labs Review Results for orders placed during the hospital encounter of 11/12/13  COMPREHENSIVE METABOLIC PANEL      Result Value Ref Range   Sodium 141  137 - 147 mEq/L   Potassium 3.5 (*) 3.7 - 5.3 mEq/L   Chloride 105  96 - 112 mEq/L   CO2 25  19 - 32 mEq/L   Glucose, Bld 107 (*) 70 - 99 mg/dL   BUN 15  6 - 23 mg/dL   Creatinine, Ser 1.610.80  0.50 - 1.10  mg/dL   Calcium 9.6  8.4 - 09.610.5 mg/dL   Total Protein 6.7  6.0 - 8.3 g/dL   Albumin 4.0  3.5 - 5.2 g/dL   AST 13  0 - 37 U/L   ALT 6  0 - 35 U/L   Alkaline Phosphatase 53  39 - 117 U/L   Total Bilirubin <0.2 (*) 0.3 - 1.2 mg/dL   GFR calc non Af Amer >90  >90 mL/min   GFR calc Af Amer >90  >90 mL/min  CBC WITH DIFFERENTIAL      Result Value Ref Range  WBC 7.0  4.0 - 10.5 K/uL   RBC 3.59 (*) 3.87 - 5.11 MIL/uL   Hemoglobin 11.8 (*) 12.0 - 15.0 g/dL   HCT 16.1 (*) 09.6 - 04.5 %   MCV 94.7  78.0 - 100.0 fL   MCH 32.9  26.0 - 34.0 pg   MCHC 34.7  30.0 - 36.0 g/dL   RDW 40.9  81.1 - 91.4 %   Platelets 247  150 - 400 K/uL   Neutrophils Relative % 48  43 - 77 %   Neutro Abs 3.3  1.7 - 7.7 K/uL   Lymphocytes Relative 42  12 - 46 %   Lymphs Abs 2.9  0.7 - 4.0 K/uL   Monocytes Relative 7  3 - 12 %   Monocytes Absolute 0.5  0.1 - 1.0 K/uL   Eosinophils Relative 3  0 - 5 %   Eosinophils Absolute 0.2  0.0 - 0.7 K/uL   Basophils Relative 1  0 - 1 %   Basophils Absolute 0.1  0.0 - 0.1 K/uL  MAGNESIUM      Result Value Ref Range   Magnesium 2.1  1.5 - 2.5 mg/dL   Laboratory interpretation all normal except mild anemia    Imaging Review Ct Head Wo Contrast  11/13/2013   CLINICAL DATA:  Seizure.  Severe headache.  EXAM: CT HEAD WITHOUT CONTRAST  TECHNIQUE: Contiguous axial images were obtained from the base of the skull through the vertex without intravenous contrast.  COMPARISON:  07/13/2013  FINDINGS: The ventricles and sulci are within normal limits for age. There is no evidence of acute infarct, intracranial hemorrhage, mass, midline shift, or extra-axial collection. The orbits are unremarkable. The visualized paranasal sinuses and mastoid air cells are clear. There is no evidence of acute fracture.  IMPRESSION: Unremarkable head CT.   Electronically Signed   By: Sebastian Ache   On: 11/13/2013 02:08     EKG Interpretation None      MDM   Final diagnoses:  Headache   Pseudoseizure    Disposition pending per Dr Sherron Flemings, MD, Franz Dell, MD 11/14/13 [id number]

## 2013-11-12 NOTE — ED Notes (Signed)
Pt brought from school by friend after pt has witnessed episode of shaking and ? Seizure activity, ? Urinary incontinence. Pt states she felt unwell, c/o headache prior to event

## 2013-11-12 NOTE — ED Notes (Signed)
Bed: [id number] Expected date:  Expected time:  Means of arrival:  Comments: EMS/elderly fall

## 2013-11-13 ENCOUNTER — Emergency Department (HOSPITAL_COMMUNITY): Payer: BC Managed Care – PPO

## 2013-11-13 NOTE — ED Provider Notes (Signed)
Pt checked out by Dr. Lynelle DoctorKnapp.  hA and shaking episode prior to arrival.  Sx have been occuring intermittently for 3 years with multiple neuro evals.  Pt feeling better after headache cocktail.  CT neg and pt d/ced home.  Gwyneth SproutWhitney Zuri Bradway, MD 11/13/13 [phone]

## 2013-12-01 ENCOUNTER — Ambulatory Visit (INDEPENDENT_AMBULATORY_CARE_PROVIDER_SITE_OTHER): Payer: BC Managed Care – PPO | Admitting: Gynecology

## 2013-12-01 ENCOUNTER — Encounter: Payer: Self-pay | Admitting: Gynecology

## 2013-12-01 VITALS — BP 124/70 | Resp 18 | Ht 62.0 in | Wt 103.0 lb

## 2013-12-01 DIAGNOSIS — Z0000 Encounter for general adult medical examination without abnormal findings: Secondary | ICD-10-CM

## 2013-12-01 DIAGNOSIS — Z3009 Encounter for other general counseling and advice on contraception: Secondary | ICD-10-CM

## 2013-12-01 DIAGNOSIS — F445 Conversion disorder with seizures or convulsions: Secondary | ICD-10-CM | POA: Insufficient documentation

## 2013-12-01 DIAGNOSIS — Z113 Encounter for screening for infections with a predominantly sexual mode of transmission: Secondary | ICD-10-CM

## 2013-12-01 DIAGNOSIS — Z124 Encounter for screening for malignant neoplasm of cervix: Secondary | ICD-10-CM

## 2013-12-01 DIAGNOSIS — F419 Anxiety disorder, unspecified: Secondary | ICD-10-CM | POA: Insufficient documentation

## 2013-12-01 DIAGNOSIS — Z01419 Encounter for gynecological examination (general) (routine) without abnormal findings: Secondary | ICD-10-CM

## 2013-12-01 DIAGNOSIS — Z23 Encounter for immunization: Secondary | ICD-10-CM

## 2013-12-01 LAB — POCT URINALYSIS DIPSTICK
UROBILINOGEN UA: NEGATIVE
pH, UA: 7

## 2013-12-01 NOTE — Patient Instructions (Signed)
Levonorgestrel intrauterine device (IUD) What is this medicine? LEVONORGESTREL IUD (LEE voe nor jes trel) is a contraceptive (birth control) device. The device is placed inside the uterus by a healthcare professional. It is used to prevent pregnancy and can also be used to treat heavy bleeding that occurs during your period. Depending on the device, it can be used for 3 to 5 years. This medicine may be used for other purposes; ask your health care provider or pharmacist if you have questions. COMMON BRAND NAME(S): Mirena, Skyla What should I tell my health care provider before I take this medicine? They need to know if you have any of these conditions: -abnormal Pap smear -cancer of the breast, uterus, or cervix -diabetes -endometritis -genital or pelvic infection now or in the past -have more than one sexual partner or your partner has more than one partner -heart disease -history of an ectopic or tubal pregnancy -immune system problems -IUD in place -liver disease or tumor -problems with blood clots or take blood-thinners -use intravenous drugs -uterus of unusual shape -vaginal bleeding that has not been explained -an unusual or allergic reaction to levonorgestrel, other hormones, silicone, or polyethylene, medicines, foods, dyes, or preservatives -pregnant or trying to get pregnant -breast-feeding How should I use this medicine? This device is placed inside the uterus by a health care professional. Talk to your pediatrician regarding the use of this medicine in children. Special care may be needed. Overdosage: If you think you have taken too much of this medicine contact a poison control center or emergency room at once. NOTE: This medicine is only for you. Do not share this medicine with others. What if I miss a dose? This does not apply. What may interact with this medicine? Do not take this medicine with any of the following  medications: -amprenavir -bosentan -fosamprenavir This medicine may also interact with the following medications: -aprepitant -barbiturate medicines for inducing sleep or treating seizures -bexarotene -griseofulvin -medicines to treat seizures like carbamazepine, ethotoin, felbamate, oxcarbazepine, phenytoin, topiramate -modafinil -pioglitazone -rifabutin -rifampin -rifapentine -some medicines to treat HIV infection like atazanavir, indinavir, lopinavir, nelfinavir, tipranavir, ritonavir -St. John's wort -warfarin This list may not describe all possible interactions. Give your health care provider a list of all the medicines, herbs, non-prescription drugs, or dietary supplements you use. Also tell them if you smoke, drink alcohol, or use illegal drugs. Some items may interact with your medicine. What should I watch for while using this medicine? Visit your doctor or health care professional for regular check ups. See your doctor if you or your partner has sexual contact with others, becomes HIV positive, or gets a sexual transmitted disease. This product does not protect you against HIV infection (AIDS) or other sexually transmitted diseases. You can check the placement of the IUD yourself by reaching up to the top of your vagina with clean fingers to feel the threads. Do not pull on the threads. It is a good habit to check placement after each menstrual period. Call your doctor right away if you feel more of the IUD than just the threads or if you cannot feel the threads at all. The IUD may come out by itself. You may become pregnant if the device comes out. If you notice that the IUD has come out use a backup birth control method like condoms and call your health care provider. Using tampons will not change the position of the IUD and are okay to use during your period. What side effects may I   notice from receiving this medicine? Side effects that you should report to your doctor or  health care professional as soon as possible: -allergic reactions like skin rash, itching or hives, swelling of the face, lips, or tongue -fever, flu-like symptoms -genital sores -high blood pressure -no menstrual period for 6 weeks during use -pain, swelling, warmth in the leg -pelvic pain or tenderness -severe or sudden headache -signs of pregnancy -stomach cramping -sudden shortness of breath -trouble with balance, talking, or walking -unusual vaginal bleeding, discharge -yellowing of the eyes or skin Side effects that usually do not require medical attention (report to your doctor or health care professional if they continue or are bothersome): -acne -breast pain -change in sex drive or performance -changes in weight -cramping, dizziness, or faintness while the device is being inserted -headache -irregular menstrual bleeding within first 3 to 6 months of use -nausea This list may not describe all possible side effects. Call your doctor for medical advice about side effects. You may report side effects to FDA at 1-800-FDA-1088. Where should I keep my medicine? This does not apply. NOTE: This sheet is a summary. It may not cover all possible information. If you have questions about this medicine, talk to your doctor, pharmacist, or health care provider.  2014, Elsevier/Gold Standard. (2011-09-07 13:54:04)  

## 2013-12-01 NOTE — Progress Notes (Signed)
23 y.o. Single Caucasian female   G0P0000 here for annual exam. Pt is currently sexually active.  She reports using condoms on a regular basis.  First sexual activity at 23  years old, 7 number of lifetime partners.   Pt reports having seizure 3y first one was during DepoProvera injection.  Pt had been on Nuvaring prior to that.  Pt reports still getting seizures, variable frequency and in clusters-gran mal.  Pt has been seen by neurology, normal EEG. On no medications.  Pt reports daily headaches, liability of moods.  Pt reports 50# weight loss in past 3449m.  Pt was evaluated and cleared by enodcrinology-ruled out Addison's disease, diabetes.  Cycles are irregular, can flow heavy and short or light and long.  Pt reports dysmenorrhea-advil 800mg  once a day, first day.  Patient's last menstrual period was 11/05/2013.          Sexually active: yes  The current method of family planning is condoms most of the time.    Exercising: yes  walking daily Last pap: 2013 Normal Alcohol: no Tobacco: Vaping qd  Drugs: no Gardisil: no, completed:  Labs: Endocrinologist ; Urine: Leuks 1  Health Maintenance  Topic Date Due  . Pap Smear  01/02/2007  . Tetanus/tdap  01/02/2008  . Influenza Vaccine  03/21/2014    Family History  Problem Relation Age of Onset  . Colon cancer Neg Hx   . Diabetes Mellitus II Father   . Diabetes Mellitus II Sister   . Stroke Father     x4  . Hypertension Father   . Thyroid disease Father   . Thyroid disease Sister   . Thyroid disease Brother     Patient Active Problem List   Diagnosis Date Noted  . Rectal bleeding 05/03/2011    Past Medical History  Diagnosis Date  . Depression   . Anxiety   . Anemia     as child  . Pseudoseizure   . Hormone disorder     Past Surgical History  Procedure Laterality Date  . Tonsillectomy  5th grade  . Colonoscopy  05/16/2011    Procedure: COLONOSCOPY;  Surgeon: Arlyce HarmanSandi M Fields, MD;  Location: [facility];  Service:  Endoscopy;  Laterality: N/A;  12:10    Allergies: Demerol and Lactaid  Current Outpatient Prescriptions  Medication Sig Dispense Refill  . aspirin-acetaminophen-caffeine (EXCEDRIN MIGRAINE) 250-250-65 MG per tablet Take 2 tablets by mouth every 6 (six) hours as needed for headache.      . ibuprofen (ADVIL,MOTRIN) 200 MG tablet Take 800 mg by mouth every 6 (six) hours as needed for moderate pain.      . Multiple Vitamin (MULTIVITAMIN WITH MINERALS) TABS tablet Take 1 tablet by mouth daily.       No current facility-administered medications for this visit.    ROS: Pertinent items are noted in HPI.  Exam:    BP 124/70  Resp 18  Ht 5\' 2"  (1.575 m)  Wt 103 lb (46.72 kg)  BMI 18.83 kg/m2  LMP 11/05/2013 Weight change: @WEIGHTCHANGE @ Last 3 height recordings:  Ht Readings from Last 3 Encounters:  12/01/13 5\' 2"  (1.575 m)  02/27/13 5\' 6"  (1.676 m)  01/27/13 5\' 5"  (1.651 m)   General appearance: alert, cooperative and appears stated age Head: Normocephalic, without obvious abnormality, atraumatic Neck: no adenopathy, no carotid bruit, no JVD, supple, symmetrical, trachea midline and thyroid not enlarged, symmetric, no tenderness/mass/nodules Lungs: clear to auscultation bilaterally Breasts: normal appearance, no masses or tenderness Heart:  regular rate and rhythm, S1, S2 normal, no murmur, click, rub or gallop Abdomen: soft, non-tender; bowel sounds normal; no masses,  no organomegaly Extremities: extremities normal, atraumatic, no cyanosis or edema Skin: Skin color, texture, turgor normal. No rashes or lesions Lymph nodes: Cervical, supraclavicular, and axillary nodes normal. no inguinal nodes palpated Neurologic: Grossly normal   Pelvic: External genitalia:  no lesions              Urethra: normal appearing urethra with no masses, tenderness or lesions              Bartholins and Skenes: Bartholin's, Urethra, Skene's normal                 Vagina: normal appearing vagina with  normal color and discharge, no lesions              Cervix: normal appearance              Pap taken: yes        Bimanual Exam:  Uterus:  uterus is normal size, shape, consistency and nontender                                      Adnexa:    normal adnexa in size, nontender and no masses                                      Rectovaginal: Confirms                                      Anus:  normal sphincter tone, no lesions  A: well woman Contraceptive management     P: pap smear with reflex counseled on breast self exam, mammography screening, STD prevention, adequate intake of calcium and vitamin D, diet and exercise return annually or prn Discussed STD prevention, regular condom use. Discussed HPV vaccine risks and benefits, pt  does give consent    An After Visit Summary was printed and given to the patient.

## 2013-12-02 ENCOUNTER — Telehealth: Payer: Self-pay | Admitting: Gynecology

## 2013-12-02 NOTE — Telephone Encounter (Signed)
Spoke with patient. Advised that per benefits quote received, IUD insertion is covered at 100% of allowed amount. Patient is to call within the first 5 days of her cycle to schedule the insertion.

## 2013-12-04 LAB — IPS N GONORRHOEA AND CHLAMYDIA BY PCR

## 2013-12-05 LAB — IPS PAP TEST WITH REFLEX TO HPV

## 2014-02-02 ENCOUNTER — Ambulatory Visit (INDEPENDENT_AMBULATORY_CARE_PROVIDER_SITE_OTHER): Payer: BC Managed Care – PPO | Admitting: *Deleted

## 2014-02-02 ENCOUNTER — Encounter: Payer: Self-pay | Admitting: *Deleted

## 2014-02-02 VITALS — BP 100/64 | HR 84 | Temp 98.5°F | Ht 62.0 in | Wt 98.0 lb

## 2014-02-02 DIAGNOSIS — Z23 Encounter for immunization: Secondary | ICD-10-CM

## 2014-02-02 NOTE — Progress Notes (Signed)
Patient ID: Kayla RoyaltyShelayne R Pinho, female   DOB: Nov 23, 1990, 23 y.o.   MRN: [id number] Pt presented for 2nd Gardasil injection.  Pt tolerated first injection well.  Pt has started Topamax since last injection.  Per Dr. Edward JollySilva, no interactions.  Pt is notified.  VIS given to patient. Injection tolerated well in right deltoid.  Pt will return in October for 3rd injection.

## 2014-03-11 ENCOUNTER — Telehealth: Payer: Self-pay | Admitting: Gynecology

## 2014-03-11 NOTE — Telephone Encounter (Signed)
Spoke with patient. Patient states that her LMP ended on 7/10. Patient has had unprotected sex shortly after cycle and thinks she may be pregnant. Patient has not taken an at home pregnancy test. Patient is currently using OTC treatment for yeast infection. "I think it is yeast but I am not sure. It is painful when I pee. I am nauseous and have breast tenderness. I do not know anything about pregnancy. I would rather be safe than sorry." Patient requesting appointment as soon as possible and is willing to see another provider. Appointment scheduled for tomorrow 7/23 at 11:15am with Kayla Hardy CNM. Patient agreeable to date and time.  Routing to Dr.Lathrop Cc: Kayla Hardy CNM   Routing to provider for final review. Patient agreeable to disposition. Will close encounter.

## 2014-03-11 NOTE — Telephone Encounter (Signed)
Patient would like an appointment to confirm pregnancy. °

## 2014-03-12 ENCOUNTER — Encounter: Payer: Self-pay | Admitting: Certified Nurse Midwife

## 2014-03-12 ENCOUNTER — Ambulatory Visit (INDEPENDENT_AMBULATORY_CARE_PROVIDER_SITE_OTHER): Payer: BC Managed Care – PPO | Admitting: Certified Nurse Midwife

## 2014-03-12 VITALS — BP 96/64 | HR 72 | Temp 98.1°F | Resp 16 | Ht 62.0 in | Wt 92.0 lb

## 2014-03-12 DIAGNOSIS — R350 Frequency of micturition: Secondary | ICD-10-CM

## 2014-03-12 DIAGNOSIS — Z789 Other specified health status: Secondary | ICD-10-CM

## 2014-03-12 DIAGNOSIS — N898 Other specified noninflammatory disorders of vagina: Secondary | ICD-10-CM

## 2014-03-12 DIAGNOSIS — N390 Urinary tract infection, site not specified: Secondary | ICD-10-CM

## 2014-03-12 DIAGNOSIS — Z308 Encounter for other contraceptive management: Secondary | ICD-10-CM

## 2014-03-12 DIAGNOSIS — Z202 Contact with and (suspected) exposure to infections with a predominantly sexual mode of transmission: Secondary | ICD-10-CM

## 2014-03-12 LAB — POCT URINALYSIS DIPSTICK
Bilirubin, UA: NEGATIVE
Glucose, UA: NEGATIVE
Ketones, UA: NEGATIVE
NITRITE UA: NEGATIVE
PH UA: 8
UROBILINOGEN UA: 4

## 2014-03-12 MED ORDER — PHENAZOPYRIDINE HCL 100 MG PO TABS: 100.0000 mg | ORAL_TABLET | Freq: Three times a day (TID) | ORAL | Status: DC | PRN

## 2014-03-12 MED ORDER — NITROFURANTOIN MONOHYD MACRO 100 MG PO CAPS: 100.0000 mg | ORAL_CAPSULE | Freq: Two times a day (BID) | ORAL | Status: DC

## 2014-03-12 NOTE — Progress Notes (Signed)
23 y.o.single white female g0p0 here with complaint of UTI, with onset  on 2-3 days ago. Patient complaining of urinary frequency/urgency/ and pain with urination. Patient denies fever, chills, nausea or back pain. No new personal products. Patient feels not related to sexual activity. Denies  new personal products. Patient thought she had a yeast infection at onset and treated with Monistat one day OTC. Denies vaginal itching, just increase in discharge since use of Monistat. Contraception is inconsistent use of condoms. Has had several episodes of unprotected sexual activity since last period. Patient has seizures with epilepsy, but none in past year. Desires GC,Chlamydia screening. No other health issues.  O: Healthy female WDWN Affect: Normal, orientation x 3 Skin : warm and dry CVAT: negative bilateral Abdomen: positive for suprapubic tenderness  Pelvic exam: External genital area: normal, no lesions Bladder,Urethra, Urethral meatus: tender Vagina: white  vaginal discharge, normal appearance  Wet prep taken, ph 4.0 Cervix: normal, non tender Uterus:normal,non tender Adnexa: normal non tender, no fullness or masse  Wet prep: negative for pathogens  A: UTI Contraception inconsistent condom use Negative wet prep POCT UPT negative STD screening  P: Reviewed findings of UTI ZO:[id number]:Macrobid see order Rx Pyridium see order UJW:JXBJYLab:Urine micro, culture, HCG qualitative, Gc,Chlamydia Reviewed warning signs and symptoms of UTI and need to advise if occurs. Increase water intake. Reviewed findings of negative wet prep Discussed consistent condom use for pregnancy and STD protection.  Encouraged to limit soda, tea, and coffee RV 2 week if TOC needed for positive culture  RV prn

## 2014-03-12 NOTE — Patient Instructions (Signed)

## 2014-03-13 LAB — HCG, SERUM, QUALITATIVE: Preg, Serum: NEGATIVE

## 2014-03-13 LAB — URINALYSIS, MICROSCOPIC ONLY
BACTERIA UA: NONE SEEN
CASTS: NONE SEEN
Crystals: NONE SEEN
Squamous Epithelial / LPF: NONE SEEN

## 2014-03-13 LAB — URINE CULTURE
COLONY COUNT: NO GROWTH
Organism ID, Bacteria: NO GROWTH

## 2014-03-13 NOTE — Progress Notes (Signed)
Note reviewed, agree with plan.  Keylan Costabile, MD  

## 2014-03-14 LAB — IPS N GONORRHOEA AND CHLAMYDIA BY PCR

## 2014-03-16 ENCOUNTER — Telehealth: Payer: Self-pay | Admitting: Emergency Medicine

## 2014-03-16 NOTE — Telephone Encounter (Signed)
Spoke with patient and message from Verner Choleborah S. Leonard CNM given. Message from provider given and advised of positive Gonorrhea Results.  Advised will need to complete treatment and notify partner(s). Stressed need to notify partner(s) and abstain until treatment.   Office visit scheduled for 03/17/14 with Verner Choleborah S. Leonard CNM for treatment.   Advised reportable condition and that report would be sent to health department.Confidential communicable disease report-Part one completed [facility], faxed with fax confirmation received and original sent to medical records.   Patient did not have any questions regarding testing/treatment at this time. Advised to call back with any, she is agreeable and verbalized understanding for very important need for treatment and follow up.   Routing to provider for final review. Patient agreeable to disposition. Will close encounter

## 2014-03-16 NOTE — Telephone Encounter (Signed)
Message copied by Joeseph AmorFAST, Javarius Tsosie L on Mon Mar 16, 2014  3:09 PM ------      Message from: Verner CholLEONARD, DEBORAH S      Created: Mon Mar 16, 2014 12:44 PM       Notify patient that her chlamydia is negative and urine culture negative      GC positive needs OV and treatment, please schedule, Partner needs to be treated, so patient will need to notify ------

## 2014-03-17 ENCOUNTER — Encounter: Payer: Self-pay | Admitting: Certified Nurse Midwife

## 2014-03-17 ENCOUNTER — Ambulatory Visit (INDEPENDENT_AMBULATORY_CARE_PROVIDER_SITE_OTHER): Payer: BC Managed Care – PPO | Admitting: Certified Nurse Midwife

## 2014-03-17 VITALS — BP 100/64 | HR 64 | Resp 16 | Ht 62.0 in | Wt 91.0 lb

## 2014-03-17 DIAGNOSIS — A549 Gonococcal infection, unspecified: Secondary | ICD-10-CM

## 2014-03-17 DIAGNOSIS — Z202 Contact with and (suspected) exposure to infections with a predominantly sexual mode of transmission: Secondary | ICD-10-CM

## 2014-03-17 DIAGNOSIS — A5400 Gonococcal infection of lower genitourinary tract, unspecified: Secondary | ICD-10-CM

## 2014-03-17 LAB — POCT URINE PREGNANCY: PREG TEST UR: NEGATIVE

## 2014-03-17 MED ORDER — LIDOCAINE HCL 1 % IJ SOLN: 250.0000 mg | Freq: Once | INTRAMUSCULAR | Status: DC

## 2014-03-17 MED ORDER — CEFTRIAXONE SODIUM 1 G IJ SOLR
250.0000 mg | Freq: Once | INTRAMUSCULAR | Status: AC
  Administered 2014-03-17: 250 mg via INTRAMUSCULAR

## 2014-03-17 MED ORDER — AZITHROMYCIN 1 G PO PACK: 1.0000 | PACK | Freq: Once | ORAL | Status: DC

## 2014-03-17 NOTE — Progress Notes (Signed)
23 y.o. Single Caucasian female G0P0000 here for follow up of UTI treated with Macrobid initiated on 03/12/14. Patient also here after notification of positive Gonorrhea, for evaluation and treatment. Patient's urinary symptoms are resolving. Patient has also read that Gonorrhea can cause symptoms of UTI, and she has notified partner of finding and need for treatment. Patient desires other STD screening today also.Denies pelvic pain or bleeding or vaginal odor. Very upset this was positive. Patient does not use contraception, occasional condom use. Negative UPT here on 03/12/14.  POCT UPT Negative  O: Healthy WD,WN female Affect: normal Skin:warm and dry CVAT; negative bilateral Abdomen:soft non tender, negative suprapubic Pelvic exam:EXTERNAL GENITALIA: normal appearing vulva with no masses, tenderness or lesions Bladder, Urethra, urethral meatus non tender, no discharge from meatus VAGINA: no abnormal discharge or lesions CERVIX: no lesions or cervical motion tenderness and or cervical discharge UTERUS: non tender, normal ADNEXA: no masses palpable and nontender  A:Normal pelvic exam  UTI ? Resolved Positive GC here for treatment STD screening   P: Discussed findings of normal pelvic exam and UTI symptoms could be related to GC infection. Continue to drink adequate water daily. Discussed GC exposure and positive test and etiology and importance of adequate treatment to avoid PID, infertility concerns and infection concerns.Discussed importance of partner treatment, patient going with partner to be treated. Partner with one other person who said she had been tested? And was negative. Encouraged all persons of contact needs to be treated or screened. Rocephin 250 mg IM to be given here today Rx Azithromycin  One gram see order Instructions given to stop Macrobid due to negative culture and make sure she takes the Azithromycin today.  No sexual activity until both have been retested and  results negative. Lab:STD screen  RV one week for re test, prn

## 2014-03-17 NOTE — Patient Instructions (Signed)
Gonorrhea Gonorrhea is an infection that can cause serious problems. If left untreated, the infection may:   Damage the female or female organs.   Cause women to be unable to have children (sterility).   Harm a fetus if the infected woman is pregnant.  It is important to get treatment for gonorrhea as soon as possible. It is also necessary that all your sexual partners be tested for the infection.  CAUSES  Gonorrhea is caused by bacteria called Neisseria gonorrhoeae. The infection is spread from person to person, usually by sexual contact (such as by anal, vaginal, or oral means). A newborn can contract the infection from his or her mother during birth.  SYMPTOMS  Some people with gonorrhea do not have symptoms. Symptoms may be different in females and males.  Females The most common symptoms are:   Pain in the lower abdomen.   Fever with or without chills.  Other symptoms include:   Abnormal vaginal discharge.   Painful intercourse.   Burning or itching of the vagina or lips of the vagina.   Abnormal vaginal bleeding.   Pain when urinating.   Long-lasting (chronic) pain in the lower abdomen, especially during menstruation or intercourse.   Inability to become pregnant.   Going into premature labor.   Irritation, pain, bleeding, or discharge from the rectum. This may occur if the infection was spread by anal sex.   Sore throat or swollen lymph nodes in the neck. This may occur if the infection was spread by oral sex.  Males The most common symptoms are:   Discharge from the penis.   Pain or burning during urination.   Pain or swelling in the testicles. Other symptoms may include:   Irritation, pain, bleeding, or discharge from the rectum. This may occur if the infection was spread by anal sex.   Sore throat, fever, or swollen lymph nodes in the neck. This may occur if the infection was spread by oral sex.  DIAGNOSIS  A diagnosis is made after a  physical exam is done and a sample of discharge is examined under a microscope for the presence of the bacteria. The discharge may be taken from the urethra, cervix, throat, or rectum.  TREATMENT  Gonorrhea is treated with antibiotic medicines. It is important for treatment to begin as soon as possible. Early treatment may prevent some problems from developing.  HOME CARE INSTRUCTIONS   Take medicines only as directed by your health care provider.   Take your antibiotic medicine as directed by your health care provider. Finish the antibiotic even if you start to feel better. Incomplete treatment will put you at risk for continued infection.   Do not have sex until treatment is complete or as directed by your health care provider.   Keep all follow-up visits as directed by your health care provider.   Not all test results are available during your visit. If your test results are not back during the visit, make an appointment with your health care provider to find out the results. Do not assume everything is normal if you have not heard from your health care provider or the medical facility. It is your responsibility to get your test results.  If you test positive for gonorrhea, inform your recent sexual partners. They need to be checked for gonorrhea even if they do not have symptoms. They may need treatment, even if they test negative for gonorrhea.  SEEK MEDICAL CARE IF:   You develop any bad reaction   to the medicine you were prescribed. This may include:   A rash.   Nausea.   Vomiting.   Diarrhea.   Your symptoms do not improve after a few days of taking antibiotics.   Your symptoms get worse.   You develop increased pain, such as in the testicles (for males) or in the abdomen (for females).  You have a fever. MAKE SURE YOU:   Understand these instructions.  Will watch your condition.  Will get help right away if you are not doing well or get worse. Document  Released: 08/04/2000 Document Revised: 12/22/2013 Document Reviewed: 02/12/2013 ExitCare Patient Information 2015 [employer]. This information is not intended to replace advice given to you by your health care provider. Make sure you discuss any questions you have with your health care provider.  

## 2014-03-18 LAB — STD PANEL
HIV 1&2 Ab, 4th Generation: NONREACTIVE
Hepatitis B Surface Ag: NEGATIVE

## 2014-03-18 NOTE — Progress Notes (Signed)
Reviewed personally.  M. Suzanne Jonasia Coiner, MD.  

## 2014-03-24 ENCOUNTER — Encounter: Payer: Self-pay | Admitting: Certified Nurse Midwife

## 2014-03-24 ENCOUNTER — Ambulatory Visit (INDEPENDENT_AMBULATORY_CARE_PROVIDER_SITE_OTHER): Payer: BC Managed Care – PPO | Admitting: Certified Nurse Midwife

## 2014-03-24 VITALS — BP 110/70 | HR 68 | Resp 16 | Ht 62.0 in

## 2014-03-24 DIAGNOSIS — A549 Gonococcal infection, unspecified: Secondary | ICD-10-CM

## 2014-03-24 DIAGNOSIS — A5400 Gonococcal infection of lower genitourinary tract, unspecified: Secondary | ICD-10-CM

## 2014-03-24 NOTE — Patient Instructions (Signed)
Sexually Transmitted Disease A sexually transmitted disease (STD) is a disease or infection often passed to another person during sex. However, STDs can be passed through nonsexual ways. An STD can be passed through:  Spit (saliva).  Semen.  Blood.  Mucus from the vagina.  Pee (urine). HOW CAN I LESSEN MY CHANCES OF GETTING AN STD?  Use:  Latex condoms.  Water-soluble lubricants with condoms. Do not use petroleum jelly or oils.  Dental dams. These are small pieces of latex that are used as a barrier during oral sex.  Avoid having more than one sex partner.  Do not have sex with someone who has other sex partners.  Do not have sex with anyone you do not know or who is at high risk for an STD.  Avoid risky sex that can break your skin.  Do not have sex if you have open sores on your mouth or skin.  Avoid drinking too much alcohol or taking illegal drugs. Alcohol and drugs can affect your good judgment.  Avoid oral and anal sex acts.  Get shots (vaccines) for HPV and hepatitis.  If you are at risk of being infected with HIV, it is advised that you take a certain medicine daily to prevent HIV infection. This is called pre-exposure prophylaxis (PrEP). You may be at risk if:  You are a man who has sex with other men (MSM).  You are attracted to the opposite sex (heterosexual) and are having sex with more than one partner.  You take drugs with a needle.  You have sex with someone who has HIV.  Talk with your doctor about if you are at high risk of being infected with HIV. If you begin to take PrEP, get tested for HIV first. Get tested every 3 months for as long as you are taking PrEP. WHAT SHOULD I DO IF I THINK I HAVE AN STD?  See your doctor.  Tell your sex partner(s) that you have an STD. They should be tested and treated.  Do not have sex until your doctor says it is okay. WHEN SHOULD I GET HELP? Get help right away if:  You have bad belly (abdominal)  pain.  You are a man and have puffiness (swelling) or pain in your testicles.  You are a woman and have puffiness in your vagina. Document Released: 09/14/2004 Document Revised: 08/12/2013 Document Reviewed: 01/31/2013 ExitCare Patient Information 2015 [employer]. This information is not intended to replace advice given to you by your health care provider. Make sure you discuss any questions you have with your health care provider.  

## 2014-03-24 NOTE — Progress Notes (Signed)
23 y.o. Single Caucasian female G0P0000 here for follow up of Gonorrhea treated with Rocephin(given here) and Azithromycin initiated on 03/17/14. Completed all medication as directed.  Denies any symptoms of vaginal infection, urinary infection, pelvic pain or other problems. Patient "feels much better now after completing medicine. Patient went with partner to Urgent care for treatment. Partner still in process of treatment, due medication allergy. Patient currently on crutches for injured left ankle with Orthopedic follow up this pm. No other health issues today.   O: Healthy WD,WN female Affect: normal, orientation x 3 Skin:warm and dry Abdomen:soft, non tender Pelvic exam:EXTERNAL GENITALIA: normal appearing vulva with no masses, tenderness or lesions VAGINA: no abnormal discharge or lesions CERVIX: no lesions or cervical motion tenderness and non tender UTERUS: normal, non tender ADNEXA: no masses palpable and nontender  A: Positive Gonorrhea treated, here for rescreen  Partner treated Normal pelvic exam Injured left ankle under follow up   P: Discussed findings of normal exam Lab: GC/Chlamydia  Discussed consistent condom use for contraception and STD prevention. Rescreen in  3 months with HIV, RPR Complete follow up with ankle.   RV prn

## 2014-03-25 NOTE — Progress Notes (Signed)
Reviewed personally.  M. Suzanne Sheina Mcleish, MD.  

## 2014-03-26 LAB — IPS N GONORRHOEA AND CHLAMYDIA BY PCR

## 2014-04-26 IMAGING — CR DG CHEST 2V
2 series · 2 of 2 positions shown · non-contrast
Comparison: 02/27/2013

CLINICAL DATA: Dizziness. Weakness. Cough.

EXAM:
CHEST  2 VIEW

[w chest pa]
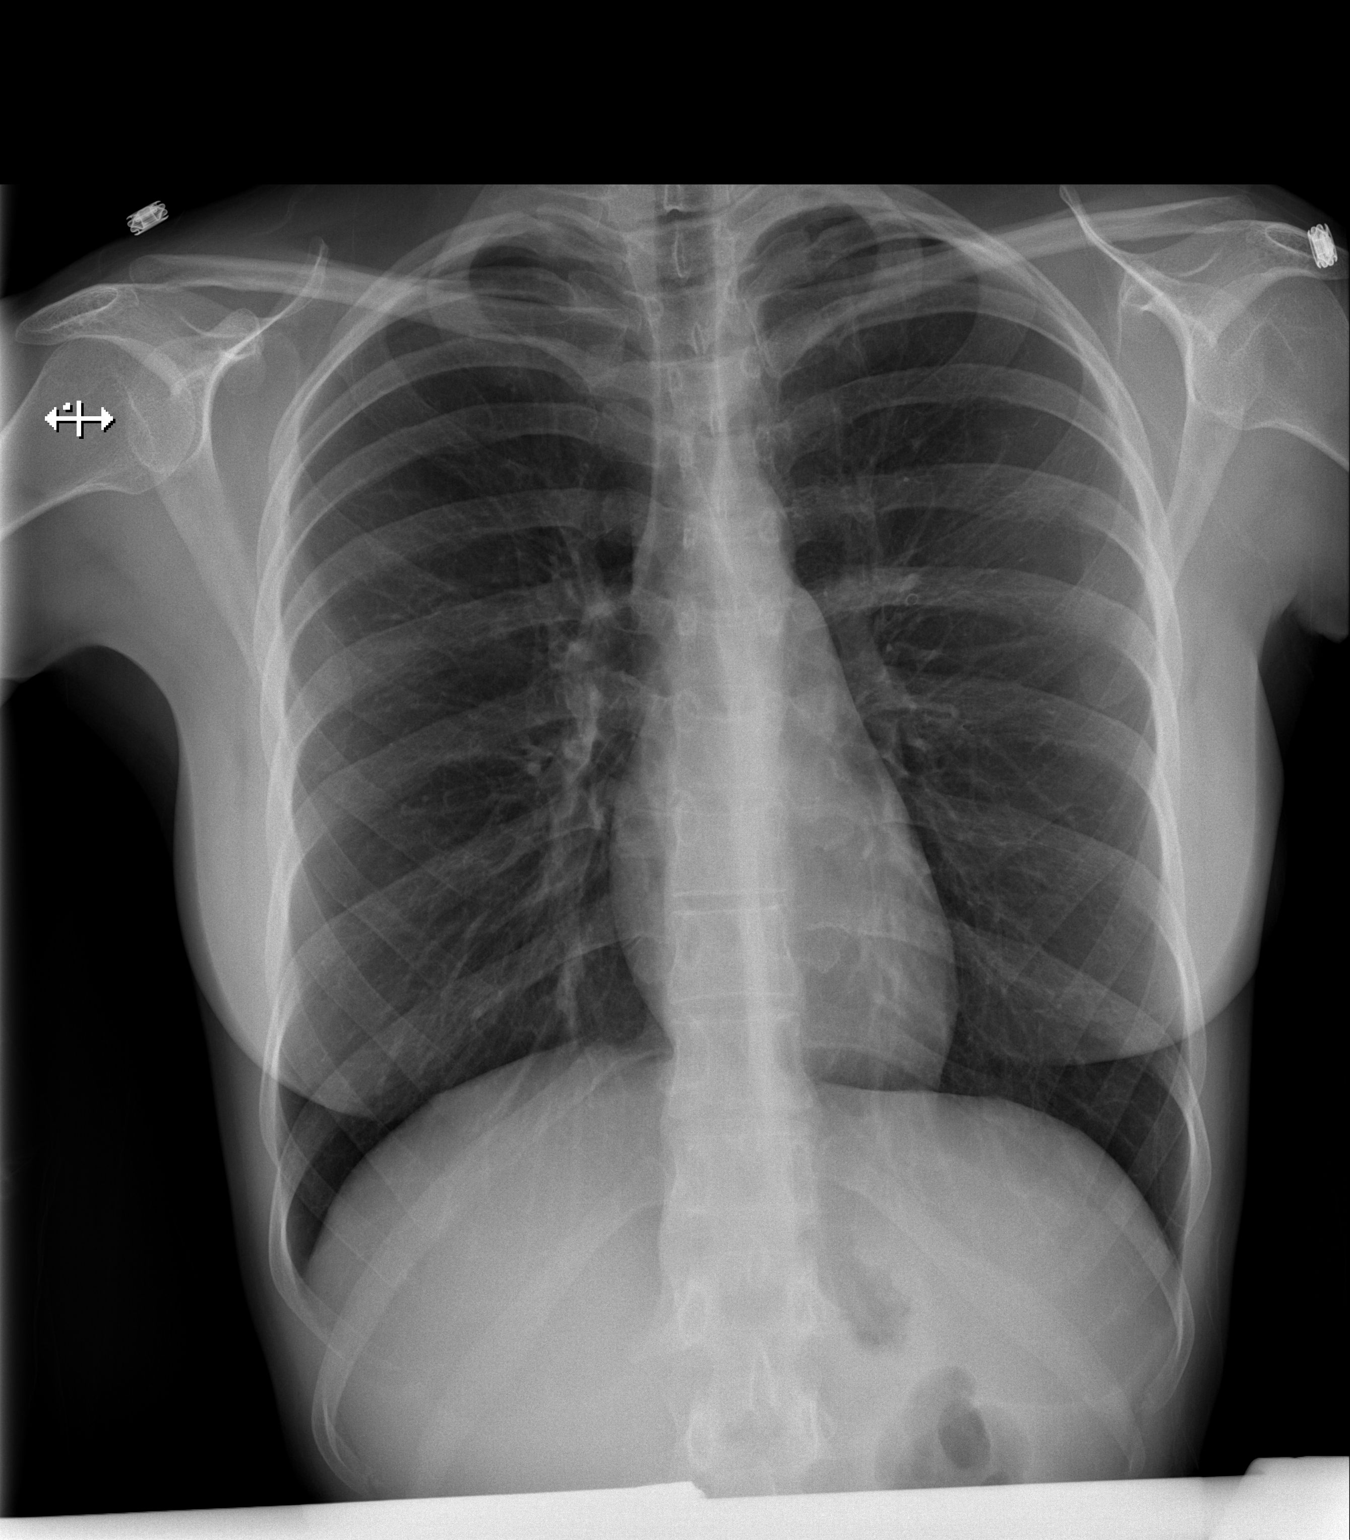

[w chest lat]
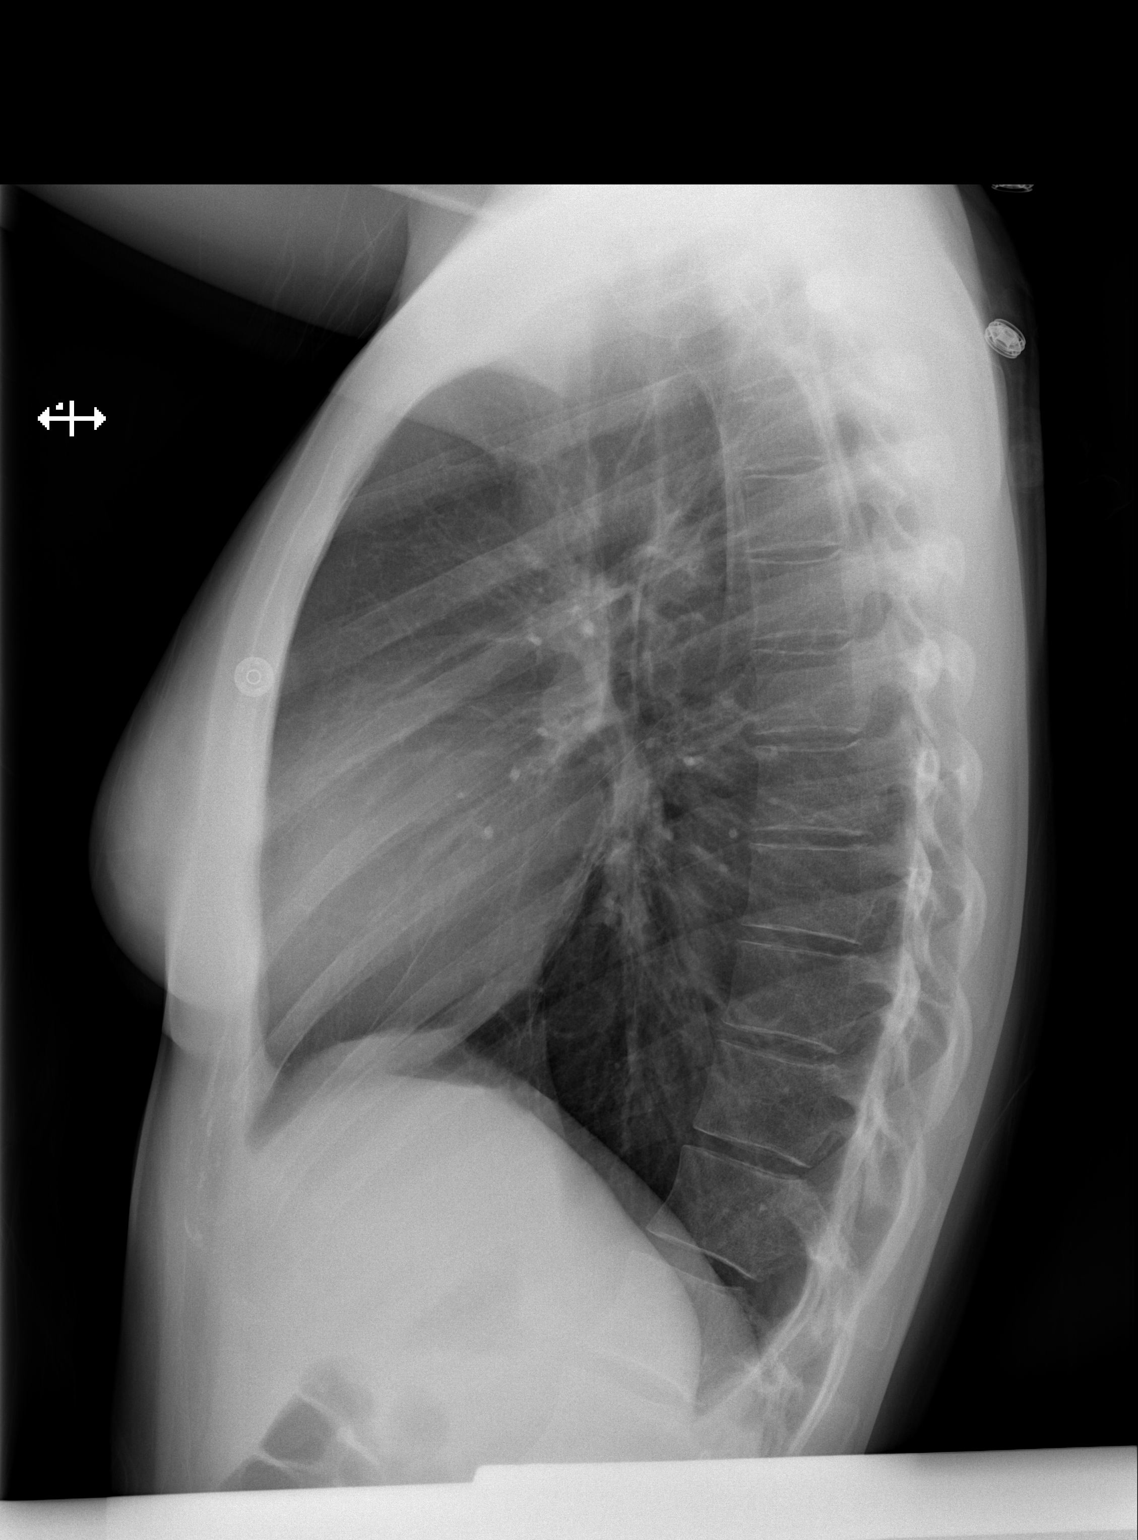

[2 of 2 positions shown; findings below may reference images not displayed]

FINDINGS: Normal heart size. Lungs are hyper aerated and clear. No
pneumothorax. Mild bronchitic changes.
IMPRESSION: Hyper aeration and bronchitic changes.

## 2014-05-28 ENCOUNTER — Encounter: Payer: Self-pay | Admitting: Certified Nurse Midwife

## 2014-05-28 ENCOUNTER — Telehealth: Payer: Self-pay | Admitting: Obstetrics & Gynecology

## 2014-05-28 ENCOUNTER — Ambulatory Visit (INDEPENDENT_AMBULATORY_CARE_PROVIDER_SITE_OTHER): Payer: BC Managed Care – PPO | Admitting: Certified Nurse Midwife

## 2014-05-28 VITALS — BP 110/70 | HR 64 | Resp 16 | Ht 62.0 in | Wt 90.0 lb

## 2014-05-28 DIAGNOSIS — Z418 Encounter for other procedures for purposes other than remedying health state: Secondary | ICD-10-CM

## 2014-05-28 DIAGNOSIS — N912 Amenorrhea, unspecified: Secondary | ICD-10-CM

## 2014-05-28 DIAGNOSIS — Z299 Encounter for prophylactic measures, unspecified: Secondary | ICD-10-CM

## 2014-05-28 DIAGNOSIS — Z30018 Encounter for initial prescription of other contraceptives: Secondary | ICD-10-CM

## 2014-05-28 DIAGNOSIS — Z23 Encounter for immunization: Secondary | ICD-10-CM

## 2014-05-28 LAB — POCT URINE PREGNANCY: Preg Test, Ur: NEGATIVE

## 2014-05-28 MED ORDER — ETONOGESTREL-ETHINYL ESTRADIOL 0.12-0.015 MG/24HR VA RING: 1.0000 | VAGINAL_RING | Freq: Once | VAGINAL | Status: DC

## 2014-05-28 NOTE — Telephone Encounter (Signed)
Opened erroneously

## 2014-05-28 NOTE — Patient Instructions (Signed)

## 2014-05-28 NOTE — Progress Notes (Signed)
Reviewed personally.  M. Suzanne Saul Fabiano, MD.  

## 2014-05-28 NOTE — Progress Notes (Signed)
23 y.o. Single Caucasian G0P0000here for discussion of other contraception options due to history of seizures and  With previous Topomax use now [facility].Previous use of OCP with missed pills, used Depo Provera with seizure with administration. Patient has used Nuvaring before without problems. Interested in IUD and Nexplanon information. Seizures better controlled now. Patient is smoker but working on cessation with success, down to less than 1/2 pack per day now.Menses duration 4 days with light to moderate  Flow, no cramping problems. Patient currently using consistent condoms. Period due soon, usually monthly but not always in the same week. Negative UPT today. No other health issues today.  O: Healthy female, WD WN Affect: normal orientation X 3    A: History of OCP uses with missed pills. P: Discussed risks and benefits of Skyla IUD and Nexplanon, insertion, removal, bleeding expectations. Questions addressed. Patient feels she would like to use Nuvaring again. Discussed starting on first day of menses and using for three continuance weeks and then remove for one week and then start with a new Nuvaring again. Patient given instruction booklet also. Voiced understanding. Patient will advise if problems with use.   28 minutes spent with patient in face to face counseling regarding contraception use.  RV prn, aex

## 2014-06-04 ENCOUNTER — Ambulatory Visit: Payer: BC Managed Care – PPO

## 2014-06-10 ENCOUNTER — Emergency Department (HOSPITAL_COMMUNITY)
Admission: EM | Admit: 2014-06-10 | Discharge: 2014-06-10 | Disposition: A | Discharge status: Home / Self Care | Payer: BC Managed Care – PPO | Attending: Emergency Medicine | Admitting: Emergency Medicine

## 2014-06-10 ENCOUNTER — Encounter (HOSPITAL_COMMUNITY): Payer: Self-pay | Admitting: Emergency Medicine

## 2014-06-10 ENCOUNTER — Emergency Department (HOSPITAL_COMMUNITY): Payer: BC Managed Care – PPO

## 2014-06-10 DIAGNOSIS — Y93K1 Activity, walking an animal: Secondary | ICD-10-CM | POA: Diagnosis not present

## 2014-06-10 DIAGNOSIS — S5012XA Contusion of left forearm, initial encounter: Secondary | ICD-10-CM | POA: Diagnosis not present

## 2014-06-10 DIAGNOSIS — S6992XA Unspecified injury of left wrist, hand and finger(s), initial encounter: Secondary | ICD-10-CM | POA: Insufficient documentation

## 2014-06-10 DIAGNOSIS — S59912A Unspecified injury of left forearm, initial encounter: Secondary | ICD-10-CM | POA: Diagnosis present

## 2014-06-10 DIAGNOSIS — Z975 Presence of (intrauterine) contraceptive device: Secondary | ICD-10-CM | POA: Diagnosis not present

## 2014-06-10 DIAGNOSIS — W5582XA Struck by other mammals, initial encounter: Secondary | ICD-10-CM | POA: Diagnosis not present

## 2014-06-10 DIAGNOSIS — D649 Anemia, unspecified: Secondary | ICD-10-CM | POA: Insufficient documentation

## 2014-06-10 DIAGNOSIS — Y9289 Other specified places as the place of occurrence of the external cause: Secondary | ICD-10-CM | POA: Insufficient documentation

## 2014-06-10 DIAGNOSIS — Z79899 Other long term (current) drug therapy: Secondary | ICD-10-CM | POA: Insufficient documentation

## 2014-06-10 DIAGNOSIS — Z8639 Personal history of other endocrine, nutritional and metabolic disease: Secondary | ICD-10-CM | POA: Insufficient documentation

## 2014-06-10 DIAGNOSIS — Z8659 Personal history of other mental and behavioral disorders: Secondary | ICD-10-CM | POA: Insufficient documentation

## 2014-06-10 DIAGNOSIS — Z720 Tobacco use: Secondary | ICD-10-CM | POA: Diagnosis not present

## 2014-06-10 DIAGNOSIS — Z8619 Personal history of other infectious and parasitic diseases: Secondary | ICD-10-CM | POA: Diagnosis not present

## 2014-06-10 MED ORDER — TETANUS-DIPHTH-ACELL PERTUSSIS 5-2.5-18.5 LF-MCG/0.5 IM SUSP
0.5000 mL | Freq: Once | INTRAMUSCULAR | Status: DC
  Filled 2014-06-10: qty 0.5

## 2014-06-10 MED ORDER — IBUPROFEN 400 MG PO TABS: 400.0000 mg | ORAL_TABLET | Freq: Four times a day (QID) | ORAL | Status: AC | PRN

## 2014-06-10 MED ORDER — HYDROCODONE-ACETAMINOPHEN 5-325 MG PO TABS: 1.0000 | ORAL_TABLET | Freq: Four times a day (QID) | ORAL | Status: DC | PRN

## 2014-06-10 NOTE — ED Notes (Signed)
Patient states approximately 30 minutes ago, she was walking pit bull and "he jerked and ran her into her door jam of her car".   Patient states her L hand and arm are throbbing.   Patient stated she was unable to move hand, but she was able to slowly work towards fist and to open up her hand.   Patient does have abrasion to L arm.

## 2014-06-10 NOTE — ED Provider Notes (Signed)
CSN: [id number]     Arrival date & time 06/10/14  0826 History  This chart was scribed for non-physician practitioner, Clabe SealLauren M Quantisha Marsicano, PA-C, working with Derwood KaplanAnkit Nanavati, MD by Charline BillsEssence Howell, ED Scribe. This patient was seen in room TR07C/TR07C and the patient's care was started at 9:29 AM.   Chief Complaint  Patient presents with  . Arm Injury  . Hand Injury   HPI Comments: Michel BickersShelayne R Hardy is a 23 y.o. female who presents to the Emergency Department complaining of L arm injury sustained this morning. She reports constant pain from her L wrist to her L elbow. She states that she was walking her dog this morning when he jerked away from her and her arm hit the car door. Last tetanus unknown. PCP: Lenise HeraldBenjamin Mann  The history is provided by the patient. No language interpreter was used.   Past Medical History  Diagnosis Date  . Depression   . Anxiety   . Anemia     as child  . Pseudoseizure   . Hormone disorder   . STD (sexually transmitted disease)     gonorrhea   Past Surgical History  Procedure Laterality Date  . Tonsillectomy  5th grade  . Colonoscopy  05/16/2011    Procedure: COLONOSCOPY;  Surgeon: Arlyce HarmanSandi M Fields, MD;  Location: [facility];  Service: Endoscopy;  Laterality: N/A;  12:10   Family History  Problem Relation Age of Onset  . Colon cancer Neg Hx   . Diabetes Mellitus II Father   . Diabetes Mellitus II Sister   . Stroke Father     x4  . Hypertension Father   . Thyroid disease Father   . Thyroid disease Sister   . Thyroid disease Brother    History  Substance Use Topics  . Smoking status: Current Every Day Smoker -- 1.00 packs/day    Types: Cigarettes  . Smokeless tobacco: Never Used     Comment: Vaping   . Alcohol Use: Yes     Comment: occ   OB History   Grav Para Term Preterm Abortions TAB SAB Ect Mult Living   0 0 0 0 0 0 0 0 0 0      Review of Systems  Musculoskeletal: Positive for arthralgias, joint swelling and myalgias.  Skin: Positive  for wound.  All other systems reviewed and are negative.  Allergies  Demerol; Eggs or egg-derived products; Lactaid; and Shellfish allergy  Home Medications   Prior to Admission medications   Medication Sig Start Date End Date Taking? Authorizing Provider  aspirin-acetaminophen-caffeine (EXCEDRIN MIGRAINE) [phone] MG per tablet Take 2 tablets by mouth every 6 (six) hours as needed for headache.    Historical Provider, MD  etonogestrel-ethinyl estradiol (NUVARING) 0.12-0.015 MG/24HR vaginal ring Place 1 each vaginally once. Insert vaginally and leave in place for 3 consecutive weeks, then remove for 1 week. 05/28/14   Verner Choleborah S Leonard, CNM  folic acid (FOLVITE) 1 MG tablet Take 1 mg by mouth daily.    Historical Provider, MD  ibuprofen (ADVIL,MOTRIN) 200 MG tablet Take 800 mg by mouth every 6 (six) hours as needed for moderate pain.    Historical Provider, MD  lacosamide (VIMPAT) 50 MG TABS tablet Take by mouth. Takes 2 pills twice daily 05/12/14 05/12/15  Historical Provider, MD  Multiple Vitamin (MULTIVITAMIN WITH MINERALS) TABS tablet Take 1 tablet by mouth daily.    Historical Provider, MD   Triage Vitals: BP 121/67  Pulse 86  Temp(Src) 98.5 F (36.9 C) (  Oral)  Resp 20  SpO2 99%  LMP 06/09/2014 Physical Exam  Nursing note and vitals reviewed. Constitutional: She is oriented to person, place, and time. She appears well-developed and well-nourished. No distress.  HENT:  Head: Normocephalic and atraumatic.  Eyes: Conjunctivae and EOM are normal.  Neck: Neck supple.  Pulmonary/Chest: Effort normal. No respiratory distress.  Musculoskeletal: Normal range of motion.       Left shoulder: She exhibits normal range of motion and no tenderness.       Left wrist: She exhibits normal range of motion and no tenderness.       Arms: Left forearm: Moderate swelling to lateral aspect, with moderate size hematoma, superficial abrasion with minimal bleeding. Normal sensation and good refill.  Compartments soft.  Neurological: She is alert and oriented to person, place, and time.  Skin: Skin is warm and dry. She is not diaphoretic.  Psychiatric: She has a normal mood and affect. Her behavior is normal.   ED Course  Procedures (including critical care time) DIAGNOSTIC STUDIES: Oxygen Saturation is 99% on RA, normal by my interpretation.    COORDINATION OF CARE: 9:33 AM-Discussed treatment plan which includes XRs, ice and elevation with pt at bedside and pt agreed to plan.   Labs Review Labs Reviewed - No data to display  Imaging Review Dg Forearm Left  06/10/2014   CLINICAL DATA:  Pulled by dog on lesion into door jam this morning. Abrasion and pain mid left forearm and pain and small laceration over second metacarpal left hand. Initial encounter.  EXAM: LEFT FOREARM - 2 VIEW  COMPARISON:  None.  FINDINGS: The radius and ulna appear intact. Elbow and wrist are located. No lytic or blastic osseous lesion or radiopaque foreign body is identified. Soft tissue swelling is present along the radial aspect of the mid to distal forearm.  IMPRESSION: Forearm soft tissue swelling without acute osseous abnormality identified.   Electronically Signed   By: Sebastian AcheAllen  Grady   On: 06/10/2014 09:15   Dg Hand Complete Left  06/10/2014   CLINICAL DATA:  Injury to left hand with lacerations and abrasions.  EXAM: LEFT HAND - COMPLETE 3+ VIEW  COMPARISON:  None.  FINDINGS: There is no evidence of fracture or dislocation. There is no evidence of arthropathy or other focal bone abnormality. Soft tissues are unremarkable. No soft tissue foreign body visualized.  IMPRESSION: Normal left hand radiographs.   Electronically Signed   By: Irish LackGlenn  Yamagata M.D.   On: 06/10/2014 09:13     EKG Interpretation None      MDM   Final diagnoses:  Forearm contusion, left, initial encounter   Patient presents with contusion to left upper [place], x-ray negative for bony findings. Mild superficial abrasion with  bleeding controlled to upper extremity, update tetanus. RICE encourage.  Meds given in ED:  Medications  Tdap (BOOSTRIX) injection 0.5 mL (not administered)    New Prescriptions   HYDROCODONE-ACETAMINOPHEN (NORCO/VICODIN) 5-325 MG PER TABLET    Take 1 tablet by mouth every 6 (six) hours as needed for moderate pain or severe pain.   IBUPROFEN (ADVIL,MOTRIN) 400 MG TABLET    Take 1 tablet (400 mg total) by mouth every 6 (six) hours as needed.   I personally performed the services described in this documentation, which was scribed in my presence. The recorded information has been reviewed and is accurate.    Mellody DrownLauren Malessa Zartman, PA-C 06/10/14 [phone]

## 2014-06-10 NOTE — Discharge Instructions (Signed)
Call for a follow up appointment with a Family or Primary Care Provider.  Call a orthopedic specialist if symptoms persists.  Return if Symptoms worsen.   Take medication as prescribed.  Ice your arm 3-4 times a day. Elevate it above your heart to decrease swelling.

## 2014-06-11 NOTE — ED Provider Notes (Signed)
Medical screening examination/treatment/procedure(s) were performed by non-physician practitioner and as supervising physician I was immediately available for consultation/collaboration.   EKG Interpretation None       Derwood KaplanAnkit Marquice Uddin, MD 06/11/14 [id number]

## 2014-07-21 ENCOUNTER — Telehealth: Payer: Self-pay | Admitting: Gynecology

## 2014-07-21 ENCOUNTER — Encounter: Payer: Self-pay | Admitting: Gynecology

## 2014-07-21 NOTE — Telephone Encounter (Signed)
Letter mailed to patient as I was not able to leave a message to rs aex 12/04/14

## 2014-12-04 ENCOUNTER — Ambulatory Visit: Payer: BC Managed Care – PPO | Admitting: Gynecology

## 2015-01-04 ENCOUNTER — Emergency Department (HOSPITAL_COMMUNITY): Payer: BLUE CROSS/BLUE SHIELD

## 2015-01-04 ENCOUNTER — Emergency Department (HOSPITAL_COMMUNITY)
Admission: EM | Admit: 2015-01-04 | Discharge: 2015-01-04 | Disposition: A | Discharge status: Home / Self Care | Payer: BLUE CROSS/BLUE SHIELD | Attending: Emergency Medicine | Admitting: Emergency Medicine

## 2015-01-04 ENCOUNTER — Encounter (HOSPITAL_COMMUNITY): Payer: Self-pay

## 2015-01-04 DIAGNOSIS — Z8659 Personal history of other mental and behavioral disorders: Secondary | ICD-10-CM | POA: Diagnosis not present

## 2015-01-04 DIAGNOSIS — Z720 Tobacco use: Secondary | ICD-10-CM | POA: Diagnosis not present

## 2015-01-04 DIAGNOSIS — G40909 Epilepsy, unspecified, not intractable, without status epilepticus: Secondary | ICD-10-CM | POA: Insufficient documentation

## 2015-01-04 DIAGNOSIS — Z8619 Personal history of other infectious and parasitic diseases: Secondary | ICD-10-CM | POA: Diagnosis not present

## 2015-01-04 DIAGNOSIS — Z79899 Other long term (current) drug therapy: Secondary | ICD-10-CM | POA: Insufficient documentation

## 2015-01-04 DIAGNOSIS — Z3202 Encounter for pregnancy test, result negative: Secondary | ICD-10-CM | POA: Insufficient documentation

## 2015-01-04 DIAGNOSIS — Z8639 Personal history of other endocrine, nutritional and metabolic disease: Secondary | ICD-10-CM | POA: Diagnosis not present

## 2015-01-04 DIAGNOSIS — Z862 Personal history of diseases of the blood and blood-forming organs and certain disorders involving the immune mechanism: Secondary | ICD-10-CM | POA: Insufficient documentation

## 2015-01-04 LAB — URINALYSIS, ROUTINE W REFLEX MICROSCOPIC
Bilirubin Urine: NEGATIVE
Glucose, UA: NEGATIVE mg/dL
Ketones, ur: NEGATIVE mg/dL
Leukocytes, UA: NEGATIVE
Nitrite: NEGATIVE
Protein, ur: NEGATIVE mg/dL
Specific Gravity, Urine: 1.01 (ref 1.005–1.030)
UROBILINOGEN UA: 0.2 mg/dL (ref 0.0–1.0)
pH: 7.5 (ref 5.0–8.0)

## 2015-01-04 LAB — CBC WITH DIFFERENTIAL/PLATELET
Basophils Absolute: 0.1 10*3/uL (ref 0.0–0.1)
Basophils Relative: 1 % (ref 0–1)
EOS ABS: 0.1 10*3/uL (ref 0.0–0.7)
EOS PCT: 1 % (ref 0–5)
HCT: 38.1 % (ref 36.0–46.0)
Hemoglobin: 13.1 g/dL (ref 12.0–15.0)
Lymphocytes Relative: 35 % (ref 12–46)
Lymphs Abs: 2 10*3/uL (ref 0.7–4.0)
MCH: 33.8 pg (ref 26.0–34.0)
MCHC: 34.4 g/dL (ref 30.0–36.0)
MCV: 98.2 fL (ref 78.0–100.0)
MONO ABS: 0.3 10*3/uL (ref 0.1–1.0)
MONOS PCT: 5 % (ref 3–12)
Neutro Abs: 3.2 10*3/uL (ref 1.7–7.7)
Neutrophils Relative %: 58 % (ref 43–77)
PLATELETS: 228 10*3/uL (ref 150–400)
RBC: 3.88 MIL/uL (ref 3.87–5.11)
RDW: 11.9 % (ref 11.5–15.5)
WBC: 5.6 10*3/uL (ref 4.0–10.5)

## 2015-01-04 LAB — BASIC METABOLIC PANEL
Anion gap: 7 (ref 5–15)
BUN: 8 mg/dL (ref 6–20)
CHLORIDE: 107 mmol/L (ref 101–111)
CO2: 27 mmol/L (ref 22–32)
Calcium: 9.3 mg/dL (ref 8.9–10.3)
Creatinine, Ser: 0.71 mg/dL (ref 0.44–1.00)
GFR calc non Af Amer: >60 mL/min (ref >60)
Glucose, Bld: 80 mg/dL (ref 65–99)
POTASSIUM: 3.8 mmol/L (ref 3.5–5.1)
Sodium: 141 mmol/L (ref 135–145)

## 2015-01-04 LAB — URINE MICROSCOPIC-ADD ON

## 2015-01-04 LAB — PREGNANCY, URINE: Preg Test, Ur: NEGATIVE

## 2015-01-04 LAB — VALPROIC ACID LEVEL: VALPROIC ACID LVL: 36 ug/mL — AB (ref 50.0–100.0)

## 2015-01-04 MED ORDER — METOCLOPRAMIDE HCL 5 MG/ML IJ SOLN
10.0000 mg | Freq: Once | INTRAMUSCULAR | Status: AC
  Administered 2015-01-04: 10 mg via INTRAVENOUS
  Filled 2015-01-04: qty 2

## 2015-01-04 MED ORDER — SODIUM CHLORIDE 0.9 % IV BOLUS (SEPSIS)
1000.0000 mL | Freq: Once | INTRAVENOUS | Status: AC
  Administered 2015-01-04: 1000 mL via INTRAVENOUS

## 2015-01-04 MED ORDER — DIPHENHYDRAMINE HCL 50 MG/ML IJ SOLN
25.0000 mg | Freq: Once | INTRAMUSCULAR | Status: AC
  Administered 2015-01-04: 25 mg via INTRAVENOUS
  Filled 2015-01-04: qty 1

## 2015-01-04 MED ORDER — KETOROLAC TROMETHAMINE 30 MG/ML IJ SOLN
30.0000 mg | Freq: Once | INTRAMUSCULAR | Status: AC
  Administered 2015-01-04: 30 mg via INTRAVENOUS
  Filled 2015-01-04: qty 1

## 2015-01-04 MED ORDER — VALPROATE SODIUM 500 MG/5ML IV SOLN
500.0000 mg | Freq: Once | INTRAVENOUS | Status: AC
  Administered 2015-01-04: 500 mg via INTRAVENOUS
  Filled 2015-01-04: qty 5

## 2015-01-04 NOTE — ED Notes (Signed)
Placed seizure padding on bed. Patient alert and comfortable at this time.

## 2015-01-04 NOTE — ED Provider Notes (Signed)
CSN: [id number]     Arrival date & time 01/04/15  1121 History  This chart was scribed for Kayla OctaveStephen Carnell Casamento, MD by Andrew Auaven Small, ED Scribe. This patient was seen in room [facility] and the patient's care was started at 12:52 PM.  Chief Complaint  Patient presents with  . Seizures   Patient is a 24 y.o. female presenting with seizures. The history is provided by the patient. No language interpreter was used.  Seizures  HPI Comments:  Kayla Hardy is a 24 y.o. female who present to the Emergency Department complaining of seizure. Pt states she had a witnessed seizure while at work today. Pt states she is aware when her seizures are coming with symptoms of a biter taste in her mouth and heart palpitation. She believes she hit her denies biting her tongue Pt states she was diagnosed with epilepsy and takes depakote twice a day for seizure and has been complaint with medication. She reports her last seizure was 3 days ago but states she doesn't usually have frequent seizures. Pt [facility]. Pt denies alcohol and drug use. Pt denies chance of being pregnant.    Past Medical History  Diagnosis Date  . Depression   . Anxiety   . Anemia     as child  . Pseudoseizure   . Hormone disorder   . STD (sexually transmitted disease)     gonorrhea   Past Surgical History  Procedure Laterality Date  . Tonsillectomy  5th grade  . Colonoscopy  05/16/2011    Procedure: COLONOSCOPY;  Surgeon: Arlyce HarmanSandi M Fields, MD;  [facility]: [facility];  Service: Endoscopy;  Laterality: N/A;  12:10   Family History  Problem Relation Age of Onset  . Colon cancer Neg Hx   . Diabetes Mellitus II Father   . Diabetes Mellitus II Sister   . Stroke Father     x4  . Hypertension Father   . Thyroid disease Father   . Thyroid disease Sister   . Thyroid disease Brother    History  Substance Use Topics  . Smoking status: Current Every Day Smoker -- 1.00 packs/day    Types: Cigarettes  .  Smokeless tobacco: Never Used     Comment: Vaping   . Alcohol Use: Yes     Comment: occ   OB History    Gravida Para Term Preterm AB TAB SAB Ectopic Multiple Living   0 0 0 0 0 0 0 0 0 0      Review of Systems  Cardiovascular: Positive for palpitations.  Gastrointestinal: Negative for abdominal pain.  Neurological: Positive for seizures.  A complete 10 system review of systems was obtained and all systems are negative except as noted in the HPI and PMH.   Allergies  Demerol; Eggs or egg-derived products; Lactaid; and Shellfish allergy  Home Medications   Prior to Admission medications   Medication Sig Start Date End Date Taking? Authorizing Provider  lacosamide (VIMPAT) 50 MG TABS tablet Take by mouth. Takes 2 pills twice daily 05/12/14 05/12/15 Yes Historical Provider, MD  Multiple Vitamin (MULTIVITAMIN WITH MINERALS) TABS tablet Take 1 tablet by mouth daily.   Yes Historical Provider, MD  etonogestrel-ethinyl estradiol (NUVARING) 0.12-0.015 MG/24HR vaginal ring Place 1 each vaginally once. Insert vaginally and leave in place for 3 consecutive weeks, then remove for 1 week. Patient not taking: Reported on 01/04/2015 05/28/14   Verner Choleborah S Leonard, CNM  HYDROcodone-acetaminophen (NORCO/VICODIN) 5-325 MG per tablet Take 1  tablet by mouth every 6 (six) hours as needed for moderate pain or severe pain. Patient not taking: Reported on 01/04/2015 06/10/14   Mellody DrownLauren Parker, PA-C  ibuprofen (ADVIL,MOTRIN) 400 MG tablet Take 1 tablet (400 mg total) by mouth every 6 (six) hours as needed. Patient not taking: Reported on 01/04/2015 06/10/14   Mellody DrownLauren Parker, PA-C   BP 97/55 mmHg  Pulse 65  Temp(Src) 97.7 F (36.5 C) (Oral)  Resp 14  Ht 5\' 3"  (1.6 m)  Wt 110 lb (49.896 kg)  BMI 19.49 kg/m2  SpO2 100%  LMP 12/19/2014 Physical Exam  Constitutional: She is oriented to person, place, and time. She appears well-developed and well-nourished. No distress.  HENT:  Head: Normocephalic and atraumatic.   Mouth/Throat: Oropharynx is clear and moist. No oropharyngeal exudate.  Eyes: Conjunctivae and EOM are normal. Pupils are equal, round, and reactive to light.  Neck: Normal range of motion. Neck supple.  No c spine tenderness.  Cardiovascular: Normal rate, regular rhythm, normal heart sounds and intact distal pulses.   No murmur heard. Pulmonary/Chest: Effort normal and breath sounds normal. No respiratory distress.  Abdominal: Soft. There is no tenderness. There is no rebound and no guarding.  Musculoskeletal: Normal range of motion. She exhibits no edema or tenderness.  Neurological: She is alert and oriented to person, place, and time. No cranial nerve deficit. She exhibits normal muscle tone. Coordination normal.  No ataxia on finger to nose bilaterally. No pronator drift. 5/5 strength throughout. CN 2-12 intact. Negative Romberg. Equal grip strength. Sensation intact. Gait is normal.   Skin: Skin is warm.  Psychiatric: She has a normal mood and affect. Her behavior is normal.  Nursing note and vitals reviewed.   ED Course  Procedures (including critical care time) DIAGNOSTIC STUDIES: Oxygen Saturation is 100% on RA, normal by my interpretation.    COORDINATION OF CARE: 1:07 PM- Pt advised of plan for treatment and pt agrees.  Labs Review Labs Reviewed  URINALYSIS, ROUTINE W REFLEX MICROSCOPIC - Abnormal; Notable for the following:    APPearance HAZY (*)    Hgb urine dipstick SMALL (*)    All other components within normal limits  VALPROIC ACID LEVEL - Abnormal; Notable for the following:    Valproic Acid Lvl 36 (*)    All other components within normal limits  URINE MICROSCOPIC-ADD ON - Abnormal; Notable for the following:    Squamous Epithelial / LPF FEW (*)    Bacteria, UA FEW (*)    All other components within normal limits  CBC WITH DIFFERENTIAL/PLATELET  BASIC METABOLIC PANEL  PREGNANCY, URINE    Imaging Review Ct Head Wo Contrast  01/04/2015   CLINICAL DATA:   Several seizures on Friday and today. Patient on anti seizure medication.  EXAM: CT HEAD WITHOUT CONTRAST  TECHNIQUE: Contiguous axial images were obtained from the base of the skull through the vertex without intravenous contrast.  COMPARISON:  None.  FINDINGS: No mass lesion, mass effect, midline shift, hydrocephalus, hemorrhage. No territorial ischemia or acute infarction. Paranasal sinuses appear within normal limits. Scout images are normal.  IMPRESSION: Negative CT head.  No change from multiple priors.   Electronically Signed   By: Andreas NewportGeoffrey  Lamke M.D.   On: 01/04/2015 14:05     EKG Interpretation None      MDM   Final diagnoses:  Seizure disorder  Patient with history of seizures versus pseudoseizures presenting with several seizures earlier today. No incontinence or postictal state. No fever or recent illness.  Patient's neurology records at [facility] reviewed. 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 She did not tolerate Vimpat. HCG Negative. depakote level low.  Call to patient's neurologist was not returned. Patient given dose of Depakote in the ED. She is tolerating by mouth and ambulatory. She appears to be at her baseline. Advised to follow up closely with her neurologist at [facility]. Return precautions discussed.   I personally performed the services described in this documentation, which was scribed in my presence. The recorded information has been reviewed and is accurate.   Kayla Octave, MD 01/04/15 [phone]

## 2015-01-04 NOTE — ED Notes (Signed)
Gave patient coke to drink.

## 2015-01-04 NOTE — ED Notes (Signed)
Pt states she had several seizures Friday and then again today. States she is on Depakote and has been taking her medication

## 2015-01-04 NOTE — ED Notes (Signed)
Pt was walking fine. Stated that she was feeling weak.

## 2015-01-04 NOTE — Discharge Instructions (Signed)
Epilepsy Follow up with your neurologist this week. Take your seizure medication as prescribed. Do not drive until cleared by your neurologist. Return to the ED if you develop new or worsening symptoms. Epilepsy is a disorder in which a person has repeated seizures over time. A seizure is a release of abnormal electrical activity in the brain. Seizures can cause a change in attention, behavior, or the ability to remain awake and alert (altered mental status). Seizures often involve uncontrollable shaking (convulsions).  Most people with epilepsy lead normal lives. However, people with epilepsy are at an increased risk of falls, accidents, and injuries. Therefore, it is important to begin treatment right away. CAUSES  Epilepsy has many possible causes. Anything that disturbs the normal pattern of brain cell activity can lead to seizures. This may include:   Head injury.  Birth trauma.  High fever as a child.  Stroke.  Bleeding into or around the brain.  Certain drugs.  Prolonged low oxygen, such as what occurs after CPR efforts.  Abnormal brain development.  Certain illnesses, such as meningitis, encephalitis (brain infection), malaria, and other infections.  An imbalance of nerve signaling chemicals (neurotransmitters).  SIGNS AND SYMPTOMS  The symptoms of a seizure can vary greatly from one person to another. Right before a seizure, you may have a warning (aura) that a seizure is about to occur. An aura may include the following symptoms:  Fear or anxiety.  Nausea.  Feeling like the room is spinning (vertigo).  Vision changes, such as seeing flashing lights or spots. Common symptoms during a seizure include:  Abnormal sensations, such as an abnormal smell or a bitter taste in the mouth.   Sudden, general body stiffness.   Convulsions that involve rhythmic jerking of the face, arm, or leg on one or both sides.   Sudden change in consciousness.   Appearing to be  awake but not responding.   Appearing to be asleep but cannot be awakened.   Grimacing, chewing, lip smacking, drooling, tongue biting, or loss of bowel or bladder control. After a seizure, you may feel sleepy for a while. DIAGNOSIS  Your health care provider will ask about your symptoms and take a medical history. Descriptions from any witnesses to your seizures will be very helpful in the diagnosis. A physical exam, including a detailed neurological exam, is necessary. Various tests may be done, such as:   An electroencephalogram (EEG). This is a painless test of your brain waves. In this test, a diagram is created of your brain waves. These diagrams can be interpreted by a specialist.  An MRI of the brain.   A CT scan of the brain.   A spinal tap (lumbar puncture, LP).  Blood tests to check for signs of infection or abnormal blood chemistry. TREATMENT  There is no cure for epilepsy, but it is generally treatable. Once epilepsy is diagnosed, it is important to begin treatment as soon as possible. For most people with epilepsy, seizures can be controlled with medicines. The following may also be used:  A pacemaker for the brain (vagus nerve stimulator) can be used for people with seizures that are not well controlled by medicine.  Surgery on the brain. For some people, epilepsy eventually goes away. HOME CARE INSTRUCTIONS   Follow your health care provider's recommendations on driving and safety in normal activities.  Get enough rest. Lack of sleep can cause seizures.  Only take over-the-counter or prescription medicines as directed by your health care provider. Take any  prescribed medicine exactly as directed.  Avoid any known triggers of your seizures.  Keep a seizure diary. Record what you recall about any seizure, especially any possible trigger.   Make sure the people you live and work with know that you are prone to seizures. They should receive instructions on how  to help you. In general, a witness to a seizure should:   Cushion your head and body.   Turn you on your side.   Avoid unnecessarily restraining you.   Not place anything inside your mouth.   Call for emergency medical help if there is any question about what has occurred.   Follow up with your health care provider as directed. You may need regular blood tests to monitor the levels of your medicine.  SEEK MEDICAL CARE IF:   You develop signs of infection or other illness. This might increase the risk of a seizure.   You seem to be having more frequent seizures.   Your seizure pattern is changing.  SEEK IMMEDIATE MEDICAL CARE IF:   You have a seizure that does not stop after a few moments.   You have a seizure that causes any difficulty in breathing.   You have a seizure that results in a very severe headache.   You have a seizure that leaves you with the inability to speak or use a part of your body.  Document Released: 08/07/2005 Document Revised: 05/28/2013 Document Reviewed: 03/19/2013 [facility] Patient Information 2015 [employer] [place]. This information is not intended to replace advice given to you by your health care provider. Make sure you discuss any questions you have with your health care provider.

## 2015-01-04 NOTE — ED Notes (Signed)
Patient drank coke with no difficulty or complaints at this time.

## 2015-04-30 ENCOUNTER — Emergency Department (HOSPITAL_COMMUNITY)
Admission: EM | Admit: 2015-04-30 | Discharge: 2015-05-01 | Disposition: A | Discharge status: Home / Self Care | Payer: BLUE CROSS/BLUE SHIELD | Attending: Emergency Medicine | Admitting: Emergency Medicine

## 2015-04-30 ENCOUNTER — Encounter (HOSPITAL_COMMUNITY): Payer: Self-pay

## 2015-04-30 DIAGNOSIS — Z79899 Other long term (current) drug therapy: Secondary | ICD-10-CM | POA: Insufficient documentation

## 2015-04-30 DIAGNOSIS — R569 Unspecified convulsions: Secondary | ICD-10-CM

## 2015-04-30 DIAGNOSIS — Z720 Tobacco use: Secondary | ICD-10-CM | POA: Insufficient documentation

## 2015-04-30 DIAGNOSIS — Z862 Personal history of diseases of the blood and blood-forming organs and certain disorders involving the immune mechanism: Secondary | ICD-10-CM | POA: Insufficient documentation

## 2015-04-30 DIAGNOSIS — F329 Major depressive disorder, single episode, unspecified: Secondary | ICD-10-CM | POA: Insufficient documentation

## 2015-04-30 DIAGNOSIS — Z8619 Personal history of other infectious and parasitic diseases: Secondary | ICD-10-CM | POA: Insufficient documentation

## 2015-04-30 DIAGNOSIS — F419 Anxiety disorder, unspecified: Secondary | ICD-10-CM | POA: Insufficient documentation

## 2015-04-30 DIAGNOSIS — Z3202 Encounter for pregnancy test, result negative: Secondary | ICD-10-CM | POA: Insufficient documentation

## 2015-04-30 DIAGNOSIS — G40909 Epilepsy, unspecified, not intractable, without status epilepticus: Secondary | ICD-10-CM | POA: Insufficient documentation

## 2015-04-30 LAB — CBC WITH DIFFERENTIAL/PLATELET
Basophils Absolute: 0 10*3/uL (ref 0.0–0.1)
Basophils Relative: 0 % (ref 0–1)
EOS ABS: 0.1 10*3/uL (ref 0.0–0.7)
EOS PCT: 1 % (ref 0–5)
HCT: 34.2 % — ABNORMAL LOW (ref 36.0–46.0)
Hemoglobin: 11.8 g/dL — ABNORMAL LOW (ref 12.0–15.0)
LYMPHS ABS: 2.1 10*3/uL (ref 0.7–4.0)
LYMPHS PCT: 22 % (ref 12–46)
MCH: 34 pg (ref 26.0–34.0)
MCHC: 34.5 g/dL (ref 30.0–36.0)
MCV: 98.6 fL (ref 78.0–100.0)
MONO ABS: 0.6 10*3/uL (ref 0.1–1.0)
Monocytes Relative: 7 % (ref 3–12)
Neutro Abs: 6.7 10*3/uL (ref 1.7–7.7)
Neutrophils Relative %: 70 % (ref 43–77)
PLATELETS: 252 10*3/uL (ref 150–400)
RBC: 3.47 MIL/uL — ABNORMAL LOW (ref 3.87–5.11)
RDW: 12.1 % (ref 11.5–15.5)
WBC: 9.6 10*3/uL (ref 4.0–10.5)

## 2015-04-30 LAB — BASIC METABOLIC PANEL WITH GFR
Anion gap: 6 (ref 5–15)
BUN: 14 mg/dL (ref 6–20)
CO2: 26 mmol/L (ref 22–32)
Calcium: 9.1 mg/dL (ref 8.9–10.3)
Chloride: 109 mmol/L (ref 101–111)
Creatinine, Ser: 0.77 mg/dL (ref 0.44–1.00)
GFR calc Af Amer: >60 mL/min
GFR calc non Af Amer: >60 mL/min
Glucose, Bld: 98 mg/dL (ref 65–99)
Potassium: 3.4 mmol/L — ABNORMAL LOW (ref 3.5–5.1)
Sodium: 141 mmol/L (ref 135–145)

## 2015-04-30 LAB — I-STAT BETA HCG BLOOD, ED (MC, WL, AP ONLY): I-stat hCG, quantitative: <5 m[IU]/mL

## 2015-04-30 MED ORDER — DIVALPROEX SODIUM 250 MG PO DR TAB: 250.0000 mg | DELAYED_RELEASE_TABLET | Freq: Two times a day (BID) | ORAL | Status: DC

## 2015-04-30 MED ORDER — VALPROATE SODIUM 500 MG/5ML IV SOLN
500.0000 mg | Freq: Once | INTRAVENOUS | Status: AC
  Administered 2015-04-30: 500 mg via INTRAVENOUS
  Filled 2015-04-30: qty 5

## 2015-04-30 NOTE — ED Notes (Addendum)
MD Beaton at bedside. 

## 2015-04-30 NOTE — Discharge Instructions (Signed)
Seizure, Adult °A seizure is abnormal electrical activity in the brain. Seizures usually last from 30 seconds to 2 minutes. There are various types of seizures. °Before a seizure, you may have a warning sensation (aura) that a seizure is about to occur. An aura may include the following symptoms:  °· Fear or anxiety. °· Nausea. °· Feeling like the room is spinning (vertigo). °· Vision changes, such as seeing flashing lights or spots. °Common symptoms during a seizure include: °· A change in attention or behavior (altered mental status). °· Convulsions with rhythmic jerking movements. °· Drooling. °· Rapid eye movements. °· Grunting. °· Loss of bladder and bowel control. °· Bitter taste in the mouth. °· Tongue biting. °After a seizure, you may feel confused and sleepy. You may also have an injury resulting from convulsions during the seizure. °HOME CARE INSTRUCTIONS  °· If you are given medicines, take them exactly as prescribed by your health care provider. °· Keep all follow-up appointments as directed by your health care provider. °· Do not swim or drive or engage in risky activity during which a seizure could cause further injury to you or others until your health care provider says it is OK. °· Get adequate rest. °· Teach friends and family what to do if you have a seizure. They should: °· Lay you on the ground to prevent a fall. °· Put a cushion under your head. °· Loosen any tight clothing around your neck. °· Turn you on your side. If vomiting occurs, this helps keep your airway clear. °· Stay with you until you recover. °· Know whether or not you need emergency care. °SEEK IMMEDIATE MEDICAL CARE IF: °· The seizure lasts longer than 5 minutes. °· The seizure is severe or you do not wake up immediately after the seizure. °· You have an altered mental status after the seizure. °· You are having more frequent or worsening seizures. °Someone should drive you to the emergency department or call local emergency  services (911 in [place] °MAKE SURE YOU: °· Understand these instructions. °· Will watch your condition. °· Will get help right away if you are not doing well or get worse. °Document Released: 08/04/2000 Document Revised: 05/28/2013 Document Reviewed: 03/19/2013 °ExitCare® Patient Information ©2015 [employer], LLC. This information is not intended to replace advice given to you by your health care provider. Make sure you discuss any questions you have with your health care provider. ° °Driving and Equipment Restrictions °Some medical problems make it dangerous to drive, ride a bike, or use machines. Some of these problems are: °· A hard blow to the head (concussion). °· Passing out (fainting). °· Twitching and shaking (seizures). °· Low blood sugar. °· Taking medicine to help you relax (sedatives). °· Taking pain medicines. °· Wearing an eye patch. °· Wearing splints. This can make it hard to use parts of your body that you need to drive safely. °HOME CARE  °· Do not drive until your doctor says it is okay. °· Do not use machines until your doctor says it is okay. °You may need a form signed by your doctor (medical release) before you can drive again. You may also need this form before you do other tasks where you need to be fully alert. °MAKE SURE YOU: °· Understand these instructions. °· Will watch your condition. °· Will get help right away if you are not doing well or get worse. °Document Released: 09/14/2004 Document Revised: 10/30/2011 Document Reviewed: 12/15/2009 °ExitCare® Patient Information ©2015 [employer], LLC.   This information is not intended to replace advice given to you by your health care provider. Make sure you discuss any questions you have with your health care provider. ° °

## 2015-04-30 NOTE — ED Notes (Signed)
Sandwich and sprite provided. 

## 2015-04-30 NOTE — ED Notes (Signed)
Bed: WA17 Expected date:  Expected time:  Means of arrival:  Comments: EMS/seizure 

## 2015-04-30 NOTE — ED Provider Notes (Signed)
CSN: [id number]     Arrival date & time 04/30/15  1728 History   First MD Initiated Contact with Patient 04/30/15 1806     Chief Complaint  Patient presents with  . Seizures      HPI Per EMS- Patient stopped her medication 2 weeks ago because she couldn't afford it.. Patient had 2 witnessed seizures at work and 2 seizures with EMS(full body). EMS gave Versed 5 mg IV prior to arrival to the ED. Past Medical History  Diagnosis Date  . Depression   . Anxiety   . Anemia     as child  . Pseudoseizure   . Hormone disorder   . STD (sexually transmitted disease)     gonorrhea   Past Surgical History  Procedure Laterality Date  . Tonsillectomy  5th grade  . Colonoscopy  05/16/2011    Procedure: COLONOSCOPY;  Surgeon: Arlyce Harman, MD;  Location: [facility];  Service: Endoscopy;  Laterality: N/A;  12:10   Family History  Problem Relation Age of Onset  . Colon cancer Neg Hx   . Diabetes Mellitus II Father   . Diabetes Mellitus II Sister   . Stroke Father     x4  . Hypertension Father   . Thyroid disease Father   . Thyroid disease Sister   . Thyroid disease Brother    Social History  Substance Use Topics  . Smoking status: Current Every Day Smoker -- 1.00 packs/day    Types: Cigarettes  . Smokeless tobacco: Never Used     Comment: Vaping   . Alcohol Use: Yes     Comment: occ   OB History    Gravida Para Term Preterm AB TAB SAB Ectopic Multiple Living       Review of Systems All other systems reviewed and are negative   Allergies  Demerol; Eggs or egg-derived products; Lactaid; and Shellfish allergy  Home Medications   Prior to Admission medications   Medication Sig Start Date End Date Taking? Authorizing Provider  ibuprofen (ADVIL,MOTRIN) 200 MG tablet Take 400 mg by mouth every 6 (six) hours as needed for headache.   Yes Historical Provider, MD  lacosamide (VIMPAT) 50 MG TABS tablet Take 100 mg by mouth daily.  05/12/14 05/12/15 Yes  Historical Provider, MD  divalproex (DEPAKOTE) 250 MG DR tablet Take 1 tablet (250 mg total) by mouth 2 (two) times daily. 04/30/15   Nelva Nay, MD  etonogestrel-ethinyl estradiol (NUVARING) 0.12-0.015 MG/24HR vaginal ring Place 1 each vaginally once. Insert vaginally and leave in place for 3 consecutive weeks, then remove for 1 week. Patient not taking: Reported on 01/04/2015 05/28/14   Verner Chol, CNM  HYDROcodone-acetaminophen (NORCO/VICODIN) 5-325 MG per tablet Take 1 tablet by mouth every 6 (six) hours as needed for moderate pain or severe pain. Patient not taking: Reported on 01/04/2015 06/10/14   Mellody Drown, PA-C  ibuprofen (ADVIL,MOTRIN) 400 MG tablet Take 1 tablet (400 mg total) by mouth every 6 (six) hours as needed. Patient not taking: Reported on 01/04/2015 06/10/14   Mellody Drown, PA-C   BP 102/59 mmHg  Pulse 67  Temp(Src) 98 F (36.7 C) (Oral)  Resp 17  SpO2 99%  LMP  Physical Exam Physical Exam  Nursing note and vitals reviewed. Constitutional: She is oriented to person, place, and time. She appears well-developed and well-nourished. No distress.  HENT:  Head: Normocephalic and atraumatic.  Eyes: Pupils are equal, round, and  reactive to light.  Neck: Normal range of motion.  Cardiovascular: Normal rate and intact distal pulses.   Pulmonary/Chest: No respiratory distress.  Abdominal: Normal appearance. She exhibits no distension.  Musculoskeletal: Normal range of motion.  Neurological: She is alert and oriented to person, place, and time. No cranial nerve deficit.  Skin: Skin is warm and dry. No rash noted.  Psychiatric: She has a normal mood and affect. Her behavior is normal.   ED Course  Procedures (including critical care time) Medications  valproate (DEPACON) 500 mg in dextrose 5 % 50 mL IVPB (500 mg Intravenous New Bag/Given 04/30/15 2110)    Labs Review Labs Reviewed  CBC WITH DIFFERENTIAL/PLATELET - Abnormal; Notable for the following:    RBC 3.47  (*)    Hemoglobin 11.8 (*)    HCT 34.2 (*)    All other components within normal limits  BASIC METABOLIC PANEL - Abnormal; Notable for the following:    Potassium 3.4 (*)    All other components within normal limits  I-STAT BETA HCG BLOOD, ED ([facility], [facility], [facility] ONLY)    Imaging Review No results found. I have personally reviewed and evaluated these images and lab results as part of my medical decision-making.  I discussed the case with Dr. Thad Ranger from neurology.  Dr. Thad Ranger spoke with the patient directly.  We have elected to start her back on Depakote which she was on before.  I gave her an IV loading dose as per Dr. Thad Ranger in order and she will get her prescription filled.  MDM   Final diagnoses:  Seizure        Nelva Nay, MD 04/30/15 2205

## 2015-04-30 NOTE — ED Notes (Signed)
MD Beaton at bedside. 

## 2015-04-30 NOTE — ED Notes (Signed)
Per EMS- Patient was taken off of her seizure medications 2 weeks ago by neurologist. Patient had 2 witnessed seizures at work and 2 seizures with EMS(full body). EMS gave Versed 5 mg IV prior to arrival to the ED.

## 2015-05-03 ENCOUNTER — Ambulatory Visit (INDEPENDENT_AMBULATORY_CARE_PROVIDER_SITE_OTHER): Payer: BLUE CROSS/BLUE SHIELD | Admitting: Neurology

## 2015-05-03 ENCOUNTER — Encounter: Payer: Self-pay | Admitting: Neurology

## 2015-05-03 VITALS — BP 104/69 | HR 64 | Ht 64.0 in | Wt 101.5 lb

## 2015-05-03 DIAGNOSIS — G40209 Localization-related (focal) (partial) symptomatic epilepsy and epileptic syndromes with complex partial seizures, not intractable, without status epilepticus: Secondary | ICD-10-CM

## 2015-05-03 HISTORY — DX: Localization-related (focal) (partial) symptomatic epilepsy and epileptic syndromes with complex partial seizures, not intractable, without status epilepticus: G40.209

## 2015-05-03 MED ORDER — FOLIC ACID 1 MG PO TABS: 1.0000 mg | ORAL_TABLET | Freq: Every day | ORAL | Status: DC

## 2015-05-03 MED ORDER — LEVETIRACETAM 500 MG PO TABS: 500.0000 mg | ORAL_TABLET | Freq: Two times a day (BID) | ORAL | Status: DC

## 2015-05-03 NOTE — Progress Notes (Signed)
Reason for visit: Seizures  Referring physician: Dr. Phillips [name] is a 24 y.o. female  History of present illness:  [name] is a 24 year old right-handed white female with a history of seizure-type events that began in 2012. The patient indicates that they started minutes after a Depo-Provera injection. She was seen previously through this office, and underwent MRI of the brain at [facility], this was done with and without contrast and was normal. 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 Eventually, she went to [facility] and underwent video EEG monitoring. The patient had 5 clinical events during this study, and the EEG was normal. The patient however, was felt to have stereotypic events, and the possibility of seizures was still entertained. She was placed on Topamax initially, but she could not tolerate the drug secondary to weight loss. She was then placed on Vimpat, and she could not tolerate this either. She was eventually placed on Depakote taking 250 mg twice daily. The patient has done quite well on the medication, her last seizure was in May 2016, but the patient ran out of her medication one month ago, and she had a flurry of seizures on 04/30/2015. The patient went to [facility] for an evaluation. She was placed back on the Depakote, and she was sent to this office for an evaluation. The patient indicates that she will have headaches for several days prior to the onset of the seizure, and then she will have a metallic sensation in the mouth, and then have dizziness and a hot feeling, and she may lose consciousness, with occasional urinary incontinence. The patient will occasionally bite her tongue. The patient indicates that she had 12 such events on September 9. She is now back on her medications, and she comes to this office. 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  Past Medical History  Diagnosis Date  . Depression   . Anxiety   . Anemia     as child  . Pseudoseizure   . Hormone disorder   . STD (sexually transmitted disease)     gonorrhea  . 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 Tonsillectomy  5th grade  . Colonoscopy  05/16/2011    Procedure: COLONOSCOPY;  Surgeon: Arlyce Harman, MD;  Location: [facility];  Service: Endoscopy;  Laterality: N/A;  12:10    Family History  Problem Relation Age of Onset  . Colon cancer Neg Hx   . Seizures Neg Hx   . Diabetes Mellitus II Father   . Stroke Father     x4  . Hypertension Father   . Thyroid disease Father   . Diabetes Mellitus II Sister   . Thyroid disease Sister   . Thyroid disease Brother   . Autism Brother   . Thyroid disease Brother   . Autism Brother     Social history:  reports that she quit smoking about 2 weeks ago. Her smoking use included Cigarettes. She smoked 1.00 pack per day. She has never used smokeless tobacco. She reports that she does not drink alcohol or use illicit drugs.  Medications:  Prior to Admission medications   Medication Sig Start Date End Date Taking? Authorizing Provider  divalproex (DEPAKOTE) 250 MG DR tablet Take 1 tablet (250 mg total) by mouth 2 (  two) times daily. 04/30/15  Yes Nelva Nay, MD  ibuprofen (ADVIL,MOTRIN) 400 MG tablet Take 1 tablet (400 mg total) by mouth every 6 (six) hours as needed. 06/10/14  Yes Mellody Drown, PA-C  folic acid (FOLVITE) 1 MG tablet Take 1 tablet (1 mg total) by mouth daily. 05/03/15   York Spaniel, MD  levETIRAcetam (KEPPRA) 500 MG tablet Take 1 tablet (500 mg total) by mouth 2 (two) times daily. 05/03/15   York Spaniel, MD      Allergies  Allergen Reactions  . Demerol Rash    Pt breaks out in red rash on her chest, neck and face.  . Eggs Or Egg-Derived Products     Makes her sick  . Lactaid [Lactase]  Diarrhea    unknown  . Shellfish Allergy Other (See Comments)    Makes her "sick"    ROS:  Out of a complete 14 system review of symptoms, the patient complains only of the following symptoms, and all other reviewed systems are negative.  Fatigue Easy bruising Increased thirst Joint pain Memory loss, confusion, headache, weakness, dizziness, seizures Anxiety, decreased energy, disinterest in activities Insomnia, sleepiness  Blood pressure 104/69, pulse 64, height 5\' 4"  (1.626 m), weight 101 lb 8 oz (46.04 kg).  Physical Exam  General: The patient is alert and cooperative at the time of the examination.  Eyes: Pupils are equal, round, and reactive to light. Discs are flat bilaterally.  Neck: The neck is supple, no carotid bruits are noted.  Respiratory: The respiratory examination is clear.  Cardiovascular: The cardiovascular examination reveals a regular rate and rhythm, no obvious murmurs or rubs are noted.  Skin: Extremities are without significant edema.  Neurologic Exam  Mental status: The patient is alert and oriented x 3 at the time of the examination. The patient has apparent normal recent and remote memory, with an apparently normal attention span and concentration ability.  Cranial nerves: Facial symmetry is present. There is good sensation of the face to pinprick on the right face, decreased on the left, the patient does not split the midline with vibration sensation. The strength of the facial muscles and the muscles to head turning and shoulder shrug are normal bilaterally. Speech is well enunciated, no aphasia or dysarthria is noted. Extraocular movements are full. Visual fields are full. The tongue is midline, and the patient has symmetric elevation of the soft palate. No obvious hearing deficits are noted.  Motor: The motor testing reveals 5 over 5 strength of all 4 extremities. Good symmetric motor tone is noted throughout.  Sensory: Sensory testing is intact  to pinprick, soft touch, vibration sensation, and position sense on all 4 extremities, with exception that position sense with some decreased in the feet. The patient reports some decrease in pinprick sensation on the left arm and leg, left face. No evidence of extinction is noted.  Coordination: Cerebellar testing reveals good finger-nose-finger and heel-to-shin bilaterally.  Gait and station: Gait is normal. Tandem gait is normal. Romberg is negative. No drift is seen.  Reflexes: Deep tendon reflexes are symmetric and normal bilaterally. Toes are downgoing bilaterally.   Assessment/Plan:  1. Seizures versus pseudoseizures  The patient has had events of altered taste sensation, and dizziness, with onset of loss of consciousness, and reports of generalized jerking. The video EEG monitoring study apparently was normal, even during these events. The patient was felt to have seizures through [facility], and has been placed on medications. The patient currently is on Depakote,  the patient is not on any birth control, and she indicates that she is sexually active. I would recommend switching off of the Depakote, and going to Keppra. We will start at the 500 mg tablets, one half tablet twice daily for 2 weeks, then go to 1 full tablet twice daily. The patient will go to 1 tablet of the Depakote daily once she starts the Keppra, and then stop the medication when she goes to 500 mg twice daily of the Keppra. The patient will follow-up in 4 months. I have indicated that she is not to operate a motor vehicle for 6 months following the last seizure. She is restricted with climbing to heights at work. 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   Marlan Palau MD 05/03/2015 7:46 PM  [facility] [address] [place], [place] [address]  Phone [phone] Fax [phone]

## 2015-05-03 NOTE — Patient Instructions (Addendum)
We will start Keppra (levacitram), starting at the 500 mg tablet, taking one half tablet twice daily for 2 weeks, then take 1 full tablet twice daily. When you start the Keppra, drop back to 1 tablet of the Depakote for 2 weeks, then stop the medication. We will add folic acid 1 mg tablets to take daily.   Epilepsy Epilepsy is a disorder in which a person has repeated seizures over time. A seizure is a release of abnormal electrical activity in the brain. Seizures can cause a change in attention, behavior, or the ability to remain awake and alert (altered mental status). Seizures often involve uncontrollable shaking (convulsions).  Most people with epilepsy lead normal lives. However, people with epilepsy are at an increased risk of falls, accidents, and injuries. Therefore, it is important to begin treatment right away. CAUSES  Epilepsy has many possible causes. Anything that disturbs the normal pattern of brain cell activity can lead to seizures. This may include:   Head injury.  Birth trauma.  High fever as a child.  Stroke.  Bleeding into or around the brain.  Certain drugs.  Prolonged low oxygen, such as what occurs after CPR efforts.  Abnormal brain development.  Certain illnesses, such as meningitis, encephalitis (brain infection), malaria, and other infections.  An imbalance of nerve signaling chemicals (neurotransmitters).  SIGNS AND SYMPTOMS  The symptoms of a seizure can vary greatly from one person to another. Right before a seizure, you may have a warning (aura) that a seizure is about to occur. An aura may include the following symptoms:  Fear or anxiety.  Nausea.  Feeling like the room is spinning (vertigo).  Vision changes, such as seeing flashing lights or spots. Common symptoms during a seizure include:  Abnormal sensations, such as an abnormal smell or a bitter taste in the mouth.   Sudden, general body stiffness.   Convulsions that involve  rhythmic jerking of the face, arm, or leg on one or both sides.   Sudden change in consciousness.   Appearing to be awake but not responding.   Appearing to be asleep but cannot be awakened.   Grimacing, chewing, lip smacking, drooling, tongue biting, or loss of bowel or bladder control. After a seizure, you may feel sleepy for a while. DIAGNOSIS  Your health care provider will ask about your symptoms and take a medical history. Descriptions from any witnesses to your seizures will be very helpful in the diagnosis. A physical exam, including a detailed neurological exam, is necessary. Various tests may be done, such as:   An electroencephalogram (EEG). This is a painless test of your brain waves. In this test, a diagram is created of your brain waves. These diagrams can be interpreted by a specialist.  An MRI of the brain.   A CT scan of the brain.   A spinal tap (lumbar puncture, LP).  Blood tests to check for signs of infection or abnormal blood chemistry. TREATMENT  There is no cure for epilepsy, but it is generally treatable. Once epilepsy is diagnosed, it is important to begin treatment as soon as possible. For most people with epilepsy, seizures can be controlled with medicines. The following may also be used:  A pacemaker for the brain (vagus nerve stimulator) can be used for people with seizures that are not well controlled by medicine.  Surgery on the brain. For some people, epilepsy eventually goes away. HOME CARE INSTRUCTIONS   Follow your health care provider's recommendations on driving and safety  in normal activities.  Get enough rest. Lack of sleep can cause seizures.  Only take over-the-counter or prescription medicines as directed by your health care provider. Take any prescribed medicine exactly as directed.  Avoid any known triggers of your seizures.  Keep a seizure diary. Record what you recall about any seizure, especially any possible trigger.    Make sure the people you live and work with know that you are prone to seizures. They should receive instructions on how to help you. In general, a witness to a seizure should:   Cushion your head and body.   Turn you on your side.   Avoid unnecessarily restraining you.   Not place anything inside your mouth.   Call for emergency medical help if there is any question about what has occurred.   Follow up with your health care provider as directed. You may need regular blood tests to monitor the levels of your medicine.  SEEK MEDICAL CARE IF:   You develop signs of infection or other illness. This might increase the risk of a seizure.   You seem to be having more frequent seizures.   Your seizure pattern is changing.  SEEK IMMEDIATE MEDICAL CARE IF:   You have a seizure that does not stop after a few moments.   You have a seizure that causes any difficulty in breathing.   You have a seizure that results in a very severe headache.   You have a seizure that leaves you with the inability to speak or use a part of your body.  Document Released: 08/07/2005 Document Revised: 05/28/2013 Document Reviewed: 03/19/2013 [facility] Patient Information 2015 [place], [place]. This information is not intended to replace advice given to you by your health care provider. Make sure you discuss any questions you have with your health care provider.

## 2015-05-26 ENCOUNTER — Telehealth: Payer: Self-pay | Admitting: Neurology

## 2015-05-26 MED ORDER — LACOSAMIDE 50 MG PO TABS: ORAL_TABLET | ORAL | Status: DC

## 2015-05-26 NOTE — Telephone Encounter (Signed)
Pt called concerned she was not going to get a call back as it is 3:20 and the office closes at 5. I explained the clinical staff continues to work after 5 and if she was informed she would be called then the RN would follow thru.

## 2015-05-26 NOTE — Telephone Encounter (Signed)
Pt called stating she started levETIRAcetam (KEPPRA) 500 MG tablet about 3 wks ago. Within 2 wks of starting medication she awoke with sores on legs with a purple center,itchy,in the bikini area and face. The bikini area looks like it blistering up due to being rubbed by clothes. She did not realize this could be a possible reaction to medication until today. Please call and advise at [phone].

## 2015-05-26 NOTE — Telephone Encounter (Signed)
I called the patient. She states the rash is in her bikini area, legs, under her arms and on her scalp. They itch really bad. She also c/o having headaches, not being able to sleep and feeling grumpy/emotional since starting Keppra. She states the only other new medication she started around the same time was folic acid. She has not had any seizures since starting Keppra. She would like to know what she can do about the itching and if she should change medications.

## 2015-05-26 NOTE — Telephone Encounter (Signed)
I called the patient. She is concerned that she is not near home and her cell phone battery is about to die. She asked that Dr. Anne Hahn please leave her a voicemail advising what to do when he is done seeing patients for the day.

## 2015-05-26 NOTE — Telephone Encounter (Signed)
I called patient. The patient developed a rash in the groin area and legs. This may be related to Keppra, she is already off of Depakote. I will have her stop the Keppra, I will switch her to Vimpat.

## 2015-05-27 ENCOUNTER — Telehealth: Payer: Self-pay | Admitting: Neurology

## 2015-05-27 NOTE — Telephone Encounter (Signed)
She called answering service last night.    She said that the Vimpat had not been called into the pharmacy. I went ahead and called in 100 mg by mouth twice a day to her pharmacy  A little bit later I got called again that the Vimpat was too expensive for her.  I spoke to the pharmacist and called in zonisamide 100 mg #60   two by mouth daily at bedtime. I told the patient to take just one po daily at bedtime for the first 3 or 4 days.

## 2015-05-31 ENCOUNTER — Telehealth: Payer: Self-pay | Admitting: Neurology

## 2015-05-31 NOTE — Telephone Encounter (Signed)
Events noted. I did not write a letter on 05/27/2015. I do not need a copy of this.

## 2015-05-31 NOTE — Telephone Encounter (Signed)
I called Leighton Parody. He stated the patient took a picture of a letter from Dr. Anne Hahn and sent it to him. He stated it was strange because it was identical to the letter Dr. Anne Hahn previously wrote, except it cut off after stating she could return to work. There is no period. The signature was so faint it could hardly be seen. He felt like the letter had been photoshopped. He stated the letter was dated 05-27-15 and that the patient told him she had been seen in our office that day. I advised that the patient was last seen in our office 05/03/15 and that is also the date of the last letter sent out from our office. He offered to send the picture of the letter she sent him. I advised that I would let Dr. Anne Hahn know the situation and contact him if we needed that picture.

## 2015-05-31 NOTE — Telephone Encounter (Signed)
Kayla Hardy with [employer] (pt's district mgr) called stating pt sent picture from her phone of letter from Dr Anne Hahn dated 05/27/15 stating that she could return to work. No period and with his signature. He is not doubting this but he has to be sure. Is this release letter legitimate. Please call and advise. He can be reached at [phone]

## 2015-06-02 NOTE — Telephone Encounter (Signed)
Susann GivensJean Melton from [employer] inquiring if you received form: cover sheet, [employer] Physician Report and 2 letters from [employer] dated 05/03/15 and 05/27/15. Please call and confirm at [phone] and fax number is [phone].

## 2015-06-03 NOTE — Telephone Encounter (Signed)
I called Carney BernJean and left a voicemail asking her to call me back.

## 2015-06-04 NOTE — Telephone Encounter (Signed)
I called Carney BernJean. I advised that I have not received a fax from their office. She is going to resend the fax.

## 2015-06-07 NOTE — Telephone Encounter (Signed)
Form completed and given to Lupita LeashDonna in medical records.

## 2015-06-07 NOTE — Telephone Encounter (Signed)
Carney BernJean called to question a fax she received stating that we need a written release from patient. I explained that all forms must go through medical records and that they need a signed release and $25 form fee to send it. She stated she would try to reach the patient. She also wanted to clarify that we did not send the letter on 05/27/15. I confirmed that we only saw the patient 05/03/15 and only wrote a letter with that date. She wanted to make sure that we were still caring for the patient and I assured her we are and that she will follow up in 4 months. She thanked me.

## 2015-08-31 ENCOUNTER — Ambulatory Visit: Payer: BLUE CROSS/BLUE SHIELD | Admitting: Adult Health

## 2015-09-01 ENCOUNTER — Encounter: Payer: Self-pay | Admitting: Adult Health

## 2015-11-26 ENCOUNTER — Encounter: Payer: Self-pay | Admitting: Neurology

## 2015-11-26 ENCOUNTER — Ambulatory Visit (INDEPENDENT_AMBULATORY_CARE_PROVIDER_SITE_OTHER): Payer: Self-pay | Admitting: Neurology

## 2015-11-26 ENCOUNTER — Telehealth: Payer: Self-pay | Admitting: Neurology

## 2015-11-26 VITALS — BP 100/70 | HR 69 | Ht 63.0 in | Wt 95.0 lb

## 2015-11-26 DIAGNOSIS — G40009 Localization-related (focal) (partial) idiopathic epilepsy and epileptic syndromes with seizures of localized onset, not intractable, without status epilepticus: Secondary | ICD-10-CM | POA: Insufficient documentation

## 2015-11-26 DIAGNOSIS — R636 Underweight: Secondary | ICD-10-CM

## 2015-11-26 MED ORDER — LAMOTRIGINE 25 MG PO TABS: ORAL_TABLET | ORAL | Status: DC

## 2015-11-26 MED ORDER — LAMOTRIGINE ER 25 MG PO TB24: ORAL_TABLET | ORAL | Status: DC

## 2015-11-26 NOTE — Telephone Encounter (Signed)
Left msg on patients vm about change of medication and new directions. Asked her to give me a call if she had any further questions. Will send Rx change to her pharmacy.

## 2015-11-26 NOTE — Telephone Encounter (Signed)
I did warn her about it and that we may use immediate-release instead, but that it would be twice a day dosing  Pls Rx Lamotrigine 25mg : Take 1 tablet once a day for 2 weeks, then increase to 1 tablet twice a day for 2 weeks, then 2 tabs twice a day and continue. Thanks!

## 2015-11-26 NOTE — Telephone Encounter (Signed)
Ins isn't covering Lamictal XR. Alternative??

## 2015-11-26 NOTE — Telephone Encounter (Signed)
PT called and left message saying the pharmacy that her prescription was sent too insurance will not pay for her prescription and wanted to know if you could try another pharmacy like [facility] or [facility]/Dawn CB# [phone]

## 2015-11-26 NOTE — Progress Notes (Signed)
NEUROLOGY CONSULTATION NOTE  Kayla Hardy MRN: [id number] DOB: 1991-04-29  Referring provider: Dr. August Saucer Primary care provider: Lenise Herald, PA-C  Reason for consult:  Establish care for seizures  Dear Dr Donette Larry:  Thank you for your kind referral of Kayla Hardy for consultation of the above symptoms. Although her history is well known to you, please allow me to reiterate it for the purpose of our medical record. Records and images were personally reviewed where available.  HISTORY OF PRESENT ILLNESS: This is a 25 year old right-handed woman with a history of anxiety presenting to establish care for seizures. Records from [facility] and [facility] were reviewed. She started having episodes in July 2012 after returning from a cruise to the [place]. She was feeling unwell but went to work, then woke up in the hospital. After waking up, she was stuttering, numb on the right side, with cramping in the right hand and foot and severe headache. There is report of imaging and EEG done at that time. After coming home, she began having seizures that initially occurred every other day then slowed down. Per records, she had a seizure-free interval for 6 months in 2014. Since then, she reported a seizure once a week. She had seen 3 neurologists until she went to [facility], all of which had told her seizures were stress-related. She describes the seizures as starting with getting nervous and anxious, followed by a metallic taste lasting seconds to all day. She would then start having numbness and tingling in her right arm, then becomes unconscious. Sometimes she has episodes where she is "there but trapped in my body," she knows what is going on and can hear and comprehend, but could not control it. She would feel confused and weak after. She has been told she has times where she would have shaking all over and "flopping." She reports having urinary incontinence and tongue bite a couple  of times with the seizures.During her 3-day video EEG monitoring stay at [facility], wake and sleep EEG was normal. There were 5 events captured consisting of an aura of pain or abnormal sensation in the head, then a scared look and at times variable degrees of stiffening of the arms and upper trunk with tachycardia that had no epileptiform correlates. It was felt that the stereotypical nature of the events and short duration raised concern for possible seizures with a deep focus vs panic attacks vs less likely PNES. Based on the clinical and electrographic data, the patient's seizures are concerning for possible epileptic events with a deep seated focus precluding the expression of an epiletiform correlate on scalp EEG. She was discharged home on Topamax but had side effects of weight loss and paresthesias. She has tried several AEDs with various side effects, Vimpat caused tongue itching and vomiting, making her feel "zombie-fied" and groggy. Depakote made her groggy. Trileptal made her feel dizzy and sedated. She had a rash a week after taking Keppra, but is unsure if rash was due to other cause. She was started on Zonisamide a year ago, instructed to take 3 capsules daily but this again caused her to feel "very zombie-ish." She takes 200mg  daily and reports weight loss, currently at 95 lbs.   She reports that since having the seizures, she has also been having more headaches. She would have worsening headaches 2-3 days before a seizure. She has not been to work the past few days because of headaches an dizziness. She reports headaches and dizziness  on a daily basis, with sharp pains in the back of her head or bilateral temporal regions. She feels nauseated and dizzy, "almost like I am drunk" with tunnel vision. Taking a nap helps. She takes Tylenol every 4 hours. She reports that last seizure was at work a week ago. She has no recollection of this. She reports poor sleep. She denies any diplopia, dysarthria,  dysphagia, neck/back pain, bowel/bladder incontinence. She is currently living with her boyfriend. 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 She denies any stress but was recently put back on Celexa at [facility] by her neurologist.   Epilepsy Risk Factors:  She had a normal birth and early development.  There is no history of febrile convulsions, CNS infections such as meningitis/encephalitis, significant traumatic brain injury, neurosurgical procedures, or family history of seizures.  Prior AEDs: Topamax, Vimpat, Depakote, Trileptal, Keppra  PAST MEDICAL HISTORY: Past Medical History  Diagnosis Date  . Depression   . Anxiety   . Anemia     as child  . Pseudoseizure   . Hormone disorder   . STD (sexually transmitted disease)     gonorrhea  . 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 Tonsillectomy  5th grade  . Colonoscopy  05/16/2011    Procedure: COLONOSCOPY;  Surgeon: Arlyce Harman, MD;  Location: [facility];  Service: Endoscopy;  Laterality: N/A;  12:10    MEDICATIONS: Current Outpatient Prescriptions on File Prior to Visit  Medication Sig Dispense Refill  . ibuprofen (ADVIL,MOTRIN) 400 MG tablet Take 1 tablet (400 mg total) by mouth every 6 (six) hours as needed. 30 tablet 0  . folic acid (FOLVITE) 1 MG tablet Take 1 tablet (1 mg total) by mouth daily. (Patient not taking: Reported on 11/26/2015) 30 tablet 5  Celexa  2 tablets every evening Zonisamide : Takes 2 capsules at night  No current facility-administered medications on file prior to visit.    ALLERGIES: Allergies  Allergen Reactions  . Demerol Rash    Pt breaks out in red rash on her chest, neck and face.  . Eggs Or Egg-Derived Products     Makes her sick  . Lactaid [Lactase] Diarrhea    unknown  . Shellfish Allergy Other (See Comments)    Makes her  "sick"  . Keppra [Levetiracetam] Rash    FAMILY HISTORY: Family History  Problem Relation Age of Onset  . Colon cancer Neg Hx   . Seizures Neg Hx   . Diabetes Mellitus II Father   . Stroke Father     x4  . Hypertension Father   . Thyroid disease Father   . Diabetes Mellitus II Sister   . Thyroid disease Sister   . Thyroid disease Brother   . Autism Brother   . Thyroid disease Brother   . Autism Brother     SOCIAL HISTORY: Social History   Social History  . Marital Status: Single    Spouse Name: N/A  . Number of Children: 0  . Years of Education: some coll.   Occupational History  . [employer]    Social History Main Topics  . Smoking status: Former Smoker -- 1.00 packs/day    Types: Cigarettes    Quit date: 04/19/2015  . Smokeless tobacco: Never Used     Comment: Vaping   . Alcohol Use: No  Comment: occ  . Drug Use: No  . Sexual Activity:    Partners: Male    Birth Control/ Protection: Condom   Other Topics Concern  . Not on file   Social History Narrative   Patient drinks 1-2 cups of caffeine daily.   Patient is right handed.    REVIEW OF SYSTEMS: Constitutional: No fevers, chills, or sweats, no generalized fatigue, change in appetite Eyes: No visual changes, double vision, eye pain Ear, nose and throat: No hearing loss, ear pain, nasal congestion, sore throat Cardiovascular: No chest pain, palpitations Respiratory:  No shortness of breath at rest or with exertion, wheezes GastrointestinaI: No nausea, vomiting, diarrhea, abdominal pain, fecal incontinence Genitourinary:  No dysuria, urinary retention or frequency Musculoskeletal:  No neck pain, back pain Integumentary: No rash, pruritus, skin lesions Neurological: as above Psychiatric: No depression, +insomnia, anxiety Endocrine: No palpitations, fatigue, diaphoresis, mood swings, change in appetite, change in weight, increased thirst Hematologic/Lymphatic:  No anemia, purpura,  petechiae. Allergic/Immunologic: no itchy/runny eyes, nasal congestion, recent allergic reactions, rashes  PHYSICAL EXAM: Filed Vitals:   11/26/15 1023  BP: 100/70  Pulse: 69   General: No acute distress Head:  Normocephalic/atraumatic Eyes: Fundoscopic exam shows bilateral sharp discs, no vessel changes, exudates, or hemorrhages Neck: supple, no paraspinal tenderness, full range of motion Back: No paraspinal tenderness Heart: regular rate and rhythm Lungs: Clear to auscultation bilaterally. Vascular: No carotid bruits. Skin/Extremities: No rash, no edema Neurological Exam: Mental status: alert and oriented to person, place, and time, no dysarthria or aphasia, Fund of knowledge is appropriate.  Remote memory intact. 1/3 delayed recall.  Attention and concentration are normal.    Able to name objects and repeat phrases. Cranial nerves: CN I: not tested CN II: pupils equal, round and reactive to light, visual fields intact, fundi unremarkable. CN III, IV, VI:  full range of motion, no nystagmus, no ptosis CN V: facial sensation intact CN VII: upper and lower face symmetric CN VIII: hearing intact to finger rub CN IX, X: gag intact, uvula midline CN XI: sternocleidomastoid and trapezius muscles intact CN XII: tongue midline Bulk & Tone: normal, no fasciculations. Motor: 5/5 throughout with no pronator drift. Sensation: intact to light touch, cold, pin, vibration and joint position sense.  No extinction to double simultaneous stimulation.  Romberg test negative Deep Tendon Reflexes: asymmetry noted over the right UE, with +3 reflexes on right UE, negative Hoffman sign, otherwise +2 throughout, no ankle clonus Plantar responses: downgoing bilaterally Cerebellar: no incoordination on finger to nose, heel to shin. No dysdiadochokinesia Gait: narrow-based and steady, able to tandem walk adequately. Tremor: none  IMPRESSION: This is a 25 year old right-handed woman with a history of  anxiety and seizures. Although some of her seizures are concerning for possible non-epileptic events (PNES), records from [facility] were reviewed and although EEG was normal throughout her stay, episodes captured were felt to be stereotypical in nature and of short duration,  raising concern for possible seizures with a deep seated focus precluding the expression of an epiletiform correlate on scalp EEG focus vs panic attacks vs less likely PNES. She was discharged on antiepileptic medication, however has been intolerant of several AEDs. She is now on Zonisamide  daily with weight loss and continued seizures once a week. Last seizure was a week ago. Options were discussed, she will start Lamotrigine ER  daily x 2 weeks with uptitration every 2 weeks. Side effects, including Levonne Spiller syndrome, were discussed. She reports seizures with right  arm, her exam today shows asymmetric reflexes over the right arm, MRI brain with and without contrast seizure protocol will be ordered to assess for focal structural abnormality. She is asking about recommendations for weight gain and will be referred to Nutrition. We discussed issues in women with epilepsy, she is sexually active and not on contraception, continue daily folic acid. [place] driving laws were discussed with the patient, and she knows to stop driving after a seizure, until 6 months seizure-free. She will follow-up in 3-4 weeks and knows to call for any changes.   Thank you for allowing me to participate in the care of this patient. Please do not hesitate to call for any questions or concerns.   Kayla Hardy, M.D.  CC: Dr. Donette LarryHusain, Lenise HeraldBenjamin Mann, PA-C

## 2015-11-26 NOTE — Patient Instructions (Addendum)
1. Start Lamotrigine ER 25mg : Take 1 tablet daily for 2 weeks, then increase to 2 tablets daily 2. Continue Zonisamide 100mg : Take 2 tablets daily 3. Schedule MRI brain with and without contrast 4. Refer to Nutrition 5. Continue daily folic acid 6. As per [place] driving laws, no driving until 6 months seizure-free 7. Follow-up in 3-4 weeks, call for any problems  Seizure Precautions: 1. If medication has been prescribed for you to prevent seizures, take it exactly as directed.  Do not stop taking the medicine without talking to your doctor first, even if you have not had a seizure in a long time.   2. Avoid activities in which a seizure would cause danger to yourself or to others.  Don't operate dangerous machinery, swim alone, or climb in high or dangerous places, such as on ladders, roofs, or girders.  Do not drive unless your doctor says you may.  3. If you have any warning that you may have a seizure, lay down in a safe place where you can't hurt yourself.    4.  No driving for 6 months from last seizure, [place].   Please refer to the following link on the Epilepsy Foundation of America's website for more information: http://www.epilepsyfoundation.org/answerplace/Social/driving/drivingu.cfm   5.  Maintain good sleep hygiene. Avoid alcohol.  6.  Notify your neurology if you are planning pregnancy or if you become pregnant.  7.  Contact your doctor if you have any problems that may be related to the medicine you are taking.  8.  Call 911 and bring the patient back to the ED if:        A.  The seizure lasts longer than 5 minutes.       B.  The patient doesn't awaken shortly after the seizure  C.  The patient has new problems such as difficulty seeing, speaking or moving  D.  The patient was injured during the seizure  E.  The patient has a temperature over 102 F (39C)  F.  The patient vomited and now is having trouble breathing

## 2015-12-06 ENCOUNTER — Ambulatory Visit: Payer: Self-pay | Admitting: Dietician

## 2015-12-10 ENCOUNTER — Ambulatory Visit (HOSPITAL_COMMUNITY)
Admission: RE | Admit: 2015-12-10 | Discharge: 2015-12-10 | Disposition: A | Discharge status: Home / Self Care | Payer: Managed Care, Other (non HMO) | Source: Ambulatory Visit | Attending: Neurology | Admitting: Neurology

## 2015-12-10 DIAGNOSIS — G40009 Localization-related (focal) (partial) idiopathic epilepsy and epileptic syndromes with seizures of localized onset, not intractable, without status epilepticus: Secondary | ICD-10-CM | POA: Diagnosis present

## 2015-12-10 MED ORDER — GADOBENATE DIMEGLUMINE 529 MG/ML IV SOLN
9.0000 mL | Freq: Once | INTRAVENOUS | Status: AC | PRN
Start: 2015-12-10 — End: 2015-12-10
  Administered 2015-12-10: 9 mL via INTRAVENOUS

## 2015-12-13 ENCOUNTER — Telehealth: Payer: Self-pay | Admitting: Family Medicine

## 2015-12-13 NOTE — Telephone Encounter (Signed)
-----   Message from Van ClinesKaren M Aquino, MD sent at 12/12/2015 11:15 PM EDT ----- Pls let her know that MRI brain is normal, no evidence of tumor, stroke, or bleed. Thanks

## 2015-12-13 NOTE — Telephone Encounter (Signed)
Patient was notified of result. 

## 2015-12-14 ENCOUNTER — Ambulatory Visit: Payer: Self-pay | Admitting: Neurology

## 2016-01-05 ENCOUNTER — Telehealth: Payer: Self-pay | Admitting: Neurology

## 2016-01-05 NOTE — Telephone Encounter (Signed)
Kayla Hardy June 05, 1991. She called wanting to see if she could get a refill for a 30 day supply on her medication Dr. Karel JarvisAquino prescribed for her. She could not remember the name of the medicine. She has only been getting 2 weeks worth. Her number is [phone]. Thank you

## 2016-01-06 NOTE — Telephone Encounter (Signed)
Spoke with patient she states that when she gets her Rx its on for 50 pills so she is having to get refill every 2 weeks with taking 2 tablets twice daily. Explained that Dr. Karel JarvisAquino did write a Rx for 120 tablets on 11/26/15. I will call pharmacy to make sure they have that Rx on file.  I spoke with the pharmacy they do have the correct Rx and quantity on file. They state they need to fix the quantity on their end so the patient can get the right amount of pills for 30 days. She states that the patient can bring in the pills that she has left and they will give her Rx for # 120 tablets.  I did call patient and left her a vm making her aware of this.

## 2016-01-11 ENCOUNTER — Telehealth: Payer: Self-pay | Admitting: Neurology

## 2016-01-11 NOTE — Telephone Encounter (Signed)
What dose of lamictal is she on?  Still on zonegran?

## 2016-01-11 NOTE — Telephone Encounter (Signed)
Pt had 4 seizures today while at work, each approximately 1 minute in length. Pt did present to hospital [facility] where pt was looked at and released. Pt reported no missed medication doses or other know trigger cause. Pt states she is feeling okay now other than a slight headache.

## 2016-01-11 NOTE — Telephone Encounter (Signed)
PT called and said she had a seizure today and wanted to let Dr Karel JarvisAquino know/Dawn [phone]

## 2016-01-11 NOTE — Telephone Encounter (Signed)
Recommend increasing lamictal to 75mg  twice daily and continue zonegran 200mg  at bedtime

## 2016-01-11 NOTE — Telephone Encounter (Signed)
Pt is still taking Lamictal 50 mg BID and Zonegran 200 mg at bedtime.

## 2016-01-11 NOTE — Telephone Encounter (Signed)
Left message for pt to call back to verify medications.

## 2016-01-12 ENCOUNTER — Telehealth: Payer: Self-pay

## 2016-01-12 MED ORDER — LAMOTRIGINE 25 MG PO TABS: 75.0000 mg | ORAL_TABLET | Freq: Two times a day (BID) | ORAL | Status: DC

## 2016-01-12 NOTE — Telephone Encounter (Signed)
Pt called back. Stated her boyfriend wanted to make sure that provider was aware that pt's anxiety has been worsening. Especially right before her seizures. Pt states she feels like her anxiety medication (celexa 20 mg daily) is no longer working. Pt states she has been on that dosage for a while. It was [facility], Dr. August SaucerAatif Husain.

## 2016-01-12 NOTE — Telephone Encounter (Signed)
Opened in error

## 2016-01-12 NOTE — Telephone Encounter (Signed)
New rx sent in. Pt aware. Will take 3 - 25 mg tablets BID until she runs out.

## 2016-01-12 NOTE — Telephone Encounter (Signed)
I think that this would be best treated by psychiatry or even just a counselor.  Give name to Kayla CampJulie Hardy (counselor) and Dr. Andee PolesParish Hardy (psychiatry)

## 2016-01-12 NOTE — Telephone Encounter (Signed)
Pt aware. She was given the contact info for Bubba CampJulie Albert [phone]) and Dr. Andee PolesParish McKinney [phone]). Pt verbalized understanding and denied questions.

## 2016-01-18 ENCOUNTER — Telehealth: Payer: Self-pay | Admitting: Neurology

## 2016-01-18 NOTE — Telephone Encounter (Signed)
Kayla Hardy May 14, 1991. She has lost her job and [employer]. She was wanting to know if there was any kind of assistance that she could receive. I was not sure of any. Her number is [phone]. Thank you

## 2016-01-18 NOTE — Telephone Encounter (Signed)
Returned patients call. She states she has lost her job & health ins. Wanted to know if we knew of some resources that could help her with meds, appts, etc. I did give her the number for the Epilepsy Foundation for possible medication assistance. I also mailed her a [facility] financial assistance application in the mail, directed to completely fill out forms and mail to address listed.

## 2016-03-06 ENCOUNTER — Encounter: Payer: Self-pay | Admitting: Neurology

## 2016-03-06 ENCOUNTER — Ambulatory Visit (INDEPENDENT_AMBULATORY_CARE_PROVIDER_SITE_OTHER): Payer: Self-pay | Admitting: Neurology

## 2016-03-06 VITALS — BP 98/58 | HR 73 | Temp 98.7°F | Ht 63.0 in | Wt 98.1 lb

## 2016-03-06 DIAGNOSIS — G40009 Localization-related (focal) (partial) idiopathic epilepsy and epileptic syndromes with seizures of localized onset, not intractable, without status epilepticus: Secondary | ICD-10-CM

## 2016-03-06 MED ORDER — LAMOTRIGINE 100 MG PO TABS: ORAL_TABLET | ORAL | Status: DC

## 2016-03-06 NOTE — Progress Notes (Signed)
NEUROLOGY FOLLOW UP OFFICE NOTE  Kayla BickersShelayne R Hardy [id number]  HISTORY OF PRESENT ILLNESS: I had the pleasure of seeing Kayla Hardy in follow-up in the neurology clinic on 03/06/2016.  The patient was last seen 3 months ago for a diagnosis of seizures. On her last visit, she was reporting side effects on Zonisamide and was started on Lamotrigine. She called our office last 01/11/16 to report 4 seizures in one day while at work, each lasting 1 minute but occurring back to back. Co-worker laid her on the couch but she apparently rolled over shaking and hit her head. There was associated urinary incontinence, no tongue bite. She was advised to increase Lamotrigine to 75mg  BID and continue Zonisamide 200mg  qhs. Since then she had only a "little" seizure in June. She is tolerating Lamotrigine well with no side effects. She denies any headaches, diplopia, dysarthria, dysphagia, neck/back pain, bowel/bladder incontinence. She has occasional dizziness. She lost her job after the seizure and is feeling depressed but denies any suicidal ideation. She feels the Citalopram is not helping anymore, and is awaiting Psychiatry consult.   I personally reviewed MRI brain with and without contrast done 12/10/15 which was normal, hippocampi symmetric with no abnormal signal or enhancement.  HPI 11/26/15: This is a 25 yo RH woman with a history of anxiety and seizures. [facility] were reviewed. She started having episodes in July 2012 after returning from a cruise [place] the [place]. She was feeling unwell but went to work, then woke up in the hospital. After waking up, she was stuttering, numb on the right side, with cramping in the right hand and foot and severe headache. There is report of imaging and EEG done at that time. After coming home, she began having seizures that initially occurred every other day then slowed down. Per records, she had a seizure-free interval for 6 months in  2014. Since then, she reported a seizure once a week. She had seen 3 neurologists until she went to [facility], all of which had told her seizures were stress-related. She describes the seizures as starting with getting nervous and anxious, followed by a metallic taste lasting seconds to all day. She would then start having numbness and tingling in her right arm, then becomes unconscious. Sometimes she has episodes where she is "there but trapped in my body," she knows what is going on and can hear and comprehend, but could not control it. She would feel confused and weak after. She has been told she has times where she would have shaking all over and "flopping." She reports having urinary incontinence and tongue bite a couple of times with the seizures.During her 3-day video EEG monitoring [facility], wake and sleep EEG was normal. There were 5 events captured consisting of an aura of pain or abnormal sensation in the head, then a scared look and at times variable degrees of stiffening of the arms and upper trunk with tachycardia that had no epileptiform correlates. It was felt that the stereotypical nature of the events and short duration raised concern for possible seizures with a deep focus vs panic attacks vs less likely PNES. Based on the clinical and electrographic data, the patient's seizures are concerning for possible epileptic events with a deep seated focus precluding the expression of an epiletiform correlate on scalp EEG. She was discharged home on Topamax but had side effects of weight loss and paresthesias. She has tried several AEDs with various side effects, Vimpat  caused tongue itching and vomiting, making her feel "zombie-fied" and groggy. Depakote made her groggy. Trileptal made her feel dizzy and sedated. She had a rash a week after taking Keppra, but is unsure if rash was due to other cause. She was started on Zonisamide a year ago, instructed to take 3 capsules daily but this again caused her to  feel "very zombie-ish." She takes 200mg  daily and reports weight loss, currently at 95 lbs.   She reports that since having the seizures, she has also been having more headaches. She would have worsening headaches 2-3 days before a seizure. She reports headaches and dizziness on a daily basis, with sharp pains in the back of her head or bilateral temporal regions. She feels nauseated and dizzy, "almost like I am drunk" with tunnel vision. Taking a nap helps. She takes Tylenol every 4 hours.    She is currently living with her boyfriend. 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 She denies any stress but was recently put back on Celexa at [facility] by her neurologist.   Epilepsy Risk Factors: She had a normal birth and early development. There is no history of febrile convulsions, CNS infections such as meningitis/encephalitis, significant traumatic brain injury, neurosurgical procedures, or family history of seizures.  Prior AEDs: Topamax, Vimpat, Depakote, Trileptal, Keppra, Zonisamide    PAST MEDICAL HISTORY: Past Medical History  Diagnosis Date  . Depression   . Anxiety   . Anemia     as child  . Pseudoseizure (HCC)   . Hormone disorder   . STD (sexually transmitted disease)     gonorrhea  . 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 citalopram (CELEXA) 10 MG tablet daily. Take 2 tablets every evening    . folic acid (FOLVITE) 1 MG tablet Take 1 tablet (1 mg total) by mouth daily. 30 tablet 5  . ibuprofen (ADVIL,MOTRIN) 400 MG tablet Take 1 tablet (400 mg total) by mouth every 6 (six) hours as needed. 30 tablet 0  . lamoTRIgine (LAMICTAL) 25 MG tablet Take 3 tablets (75 mg total) by mouth 2 (two) times daily. 180 tablet 4  . zonisamide (ZONEGRAN) 100 MG capsule Take 2 capsules by mouth at bedtime.      No current facility-administered medications on file prior to visit.    ALLERGIES: Allergies  Allergen Reactions  . Demerol Anaphylaxis    Pt breaks out in red rash on her chest, neck and face.  . Eggs Or Egg-Derived Products     Makes her sick  . Lactaid [Lactase] Diarrhea    unknown  . Shellfish Allergy Other (See Comments)    Makes her "sick"  . Keppra [Levetiracetam] Rash    FAMILY HISTORY: Family History  Problem Relation Age of Onset  . Colon cancer Neg Hx   . Seizures Neg Hx   . Diabetes Mellitus II Father   . Stroke Father     x4  . Hypertension Father   . Thyroid disease Father   . Diabetes Mellitus II Sister   . Thyroid disease Sister   . Thyroid disease Brother   . Autism Brother   . Thyroid disease Brother   . Autism Brother     SOCIAL HISTORY: Social History   Social History  . Marital Status: Single    Spouse Name: N/A  .  Number of Children: 0  . Years of Education: some coll.   Occupational History  . [employer]    Social History Main Topics  . Smoking status: Former Smoker -- 1.00 packs/day    Types: Cigarettes    Quit date: 08/19/2015  . Smokeless tobacco: Never Used  . Alcohol Use: No  . Drug Use: No  . Sexual Activity:    Partners: Male    Birth Control/ Protection: Condom   Other Topics Concern  . Not on file   Social History Narrative   Patient drinks 1-2 cups of caffeine daily.   Patient is right handed.    REVIEW OF SYSTEMS: Constitutional: No fevers, chills, or sweats, no generalized fatigue, change in appetite Eyes: No visual changes, double vision, eye pain Ear, nose and throat: No hearing loss, ear pain, nasal congestion, sore throat Cardiovascular: No chest pain, palpitations Respiratory:  No shortness of breath at rest or with exertion, wheezes GastrointestinaI: No nausea, vomiting, diarrhea, abdominal pain, fecal incontinence Genitourinary:  No dysuria, urinary retention or frequency Musculoskeletal:  No neck  pain, back pain Integumentary: No rash, pruritus, skin lesions Neurological: as above Psychiatric: + depression, insomnia, anxiety Endocrine: No palpitations, fatigue, diaphoresis, mood swings, change in appetite, change in weight, increased thirst Hematologic/Lymphatic:  No anemia, purpura, petechiae. Allergic/Immunologic: no itchy/runny eyes, nasal congestion, recent allergic reactions, rashes  PHYSICAL EXAM: Filed Vitals:   03/06/16 1410  BP: 98/58  Pulse: 73  Temp: 98.7 F (37.1 C)   General: No acute distress Head:  Normocephalic/atraumatic Neck: supple, no paraspinal tenderness, full range of motion Heart:  Regular rate and rhythm Lungs:  Clear to auscultation bilaterally Back: No paraspinal tenderness Skin/Extremities: No rash, no edema Neurological Exam: alert and oriented to person, place, and time. No aphasia or dysarthria. Fund of knowledge is appropriate.  Recent and remote memory are intact.  Attention and concentration are normal.    Able to name objects and repeat phrases. Cranial nerves: Pupils equal, round, reactive to light.  Extraocular movements intact with no nystagmus. Visual fields full. Facial sensation intact. No facial asymmetry. Tongue, uvula, palate midline.  Motor: Bulk and tone normal, muscle strength 5/5 throughout with no pronator drift.  Sensation to light touch intact.  No extinction to double simultaneous stimulation.  Deep tendon reflexes +2 throughout, toes downgoing.  Finger to nose testing intact.  Gait narrow-based and steady, able to tandem walk adequately.  Romberg negative.  IMPRESSION: This is a 25 yo RH woman with a history of anxiety and seizures. Although some of her seizures are concerning for possible non-epileptic events (PNES), records from [facility] were reviewed and although EEG was normal throughout her stay, episodes captured were felt to be stereotypical in nature and of short duration, raising concern for possible seizures with a deep  seated focus precluding the expression of an epiletiform correlate on scalp EEG focus vs panic attacks vs less likely PNES. MRI brain normal. She was discharged on antiepileptic medication, however has been intolerant of several AEDs. She had weight loss and continued seizures once a week on Zonisamide and was started on Lamotrigine. She is doing better and tolerating medication, last seizure was a month ago. She will increase Lamotrigine to  BID and start tapering off Zonisamide. She has no pregnancy plans at this time and continues on folic acid. [place] driving laws were again discussed with the patient, and she knows to stop driving after a seizure, until 6 months seizure-free. She is awaiting Psychiatry consult for  anxiety and depression, continue Celexa for now. She will follow-up in 3 months and knows to call for any changes.   Thank you for allowing me to participate in her care.  Please do not hesitate to call for any questions or concerns.  The duration of this appointment visit was 15 minutes of face-to-face time with the patient.  Greater than 50% of this time was spent in counseling, explanation of diagnosis, planning of further management, and coordination of care.   Kayla Hardy, M.D.   CC: Lenise Herald, PA-C

## 2016-03-06 NOTE — Patient Instructions (Addendum)
1. Increase Lamotrigine to 100mg  twice a day.  2. After 2 weeks of taking higher dose of Lamotrigine, start reducing Zonisamide to 1 tablet daily for 1 week, then 1 tablet every other day for 1 week, then stop. 3. Let us know if any problems establishing care with Psychiatry 4. Follow-up in 3 months, call for any changes  Seizure Precautions: 1. If medication has been prescribed for you to prevent seizures, take it exactly as directed.  Do not stop taking the medicine without talking to your doctor first, even if you have not had a seizure in a long time.   2. Avoid activities in which a seizure would cause danger to yourself or to others.  Don't operate dangerous machinery, swim alone, or climb in high or dangerous places, such as on ladders, roofs, or girders.  Do not drive unless your doctor says you may.  3. If you have any warning that you may have a seizure, lay down in a safe place where you can't hurt yourself.    4.  No driving for 6 months from last seizure, as per [place] law.   Please refer to the following link on the Epilepsy Foundation of America's website for more information: http://www.epilepsyfoundation.org/answerplace/Social/driving/drivingu.cfm   5.  Maintain good sleep hygiene. Avoid alcohol  6.  Notify your neurology if you are planning pregnancy or if you become pregnant.  7.  Contact your doctor if you have any problems that may be related to the medicine you are taking.  8.  Call 911 and bring the patient back to the ED if:        A.  The seizure lasts longer than 5 minutes.       B.  The patient doesn't awaken shortly after the seizure  C.  The patient has new problems such as difficulty seeing, speaking or moving  D.  The patient was injured during the seizure  E.  The patient has a temperature over 102 F (39C)  F.  The patient vomited and now is having trouble breathing

## 2016-03-09 ENCOUNTER — Encounter: Payer: Self-pay | Admitting: *Deleted

## 2016-03-09 ENCOUNTER — Encounter: Payer: Self-pay | Attending: Neurology | Admitting: *Deleted

## 2016-03-09 VITALS — Ht 63.0 in | Wt 96.6 lb

## 2016-03-09 DIAGNOSIS — E441 Mild protein-calorie malnutrition: Secondary | ICD-10-CM

## 2016-03-09 DIAGNOSIS — Z713 Dietary counseling and surveillance: Secondary | ICD-10-CM | POA: Insufficient documentation

## 2016-03-09 DIAGNOSIS — R636 Underweight: Secondary | ICD-10-CM | POA: Insufficient documentation

## 2016-03-09 NOTE — Patient Instructions (Addendum)
[facility]- [phone]  Increase fluids- goal is 2-3 bottles water/day Aim for 3 meals/day and 3 snacks Meals need protein and fat, as do snacks Try to increase calories by using butter, oil, cream, cheese, sour cream, peanut butter, ranch dressing Try drinking 2% milk with all meals Can also try lemonade or juice  If you experience gas or bloating or abdominal pain, try Culturelle probiotic

## 2016-03-09 NOTE — Progress Notes (Signed)
Medical Nutrition Therapy:  Appt start time: 1400 end time:  1500.  Assessment:  Primary concerns today: Kayla CampionShelayne is here for nutrition counseling pertaining to referral for underweight status.  She has lost ~30 pounds over the past couple years.  She attributes her weight loss to anti-seizure medication.  Side affects of her medications due include decreased appetite and weight loss.  She would like to gain weight, but feels frustrated because she feels like she can't gain weight.  Seizures are somewhat improved.   Used to take Topomax and lost down to ~85 pounds.  She is currently 96 lb She has tried 2-3 Ensure/day in the past and liked those but didn't feel like it made a difference.  That increased appetite, but not weight, per patient.  States she is a really slow eater.   States she gets hiccups and burps a lot Is not able to work due to seizures isn't able to sleep at night.  Wakes up after a few hours  Positive for cold intolerance Positive for dizziness/lightheadedness LMP 02/27/16.  Cycles irregular Positive for change in hair texture/dry.  Weak fingernails Headaches constantly (but improving with seizure medication) Poor energy level Potential depression: has been referred to psychiatrist Constipation: BM every week.  This is a long standing issue Increased irritability/mood lability  No difficulty chewing or swallowing   Administered EAT-26 to screen for eating disorder. Patient score insignificant.  States she would like to weigh 130 lb   Preferred Learning Style:  No preference indicated   Learning Readiness:   Ready  MEDICATIONS: see list   DIETARY INTAKE:  Usual eating pattern includes 2 meals and 2 snacks per day. Avoided foods include none (doesn't like brussel sprouts).    24-hr recall:  B ( AM): none yesterday. Sometimes yogurt and fruit or bowl cereal Snk ( AM): none  L ( PM): salmon cakes, mashed potatoes, watermelon, cornbread.  Normally leftovers and  fruit Snk ( PM): none.  Normally fruit or cheese or cookies D ( PM): taco salad with chicken and cheese dip and tortilla chips.  Typically cooks "healthy foods": pork loin with wheat mac-n-cheese, salad Snk ( PM): none Normally fruit or cheese or cookies or ice cream Beverages: water with flavoring   Usual physical activity: ADLs  Estimated energy intake: 1000-1100 kcal  Estimated energy needs: 2200+ calories 275 g carbohydrates 110 g protein 73 g fat    Nutritional Diagnosis:  NI-1.4 Inadequate energy intake As related to decreased appetite from anti-epileptic medications.  As evidenced by ~30 lb weight loss over 3 years.    Intervention:  Nutrition counseling provided.  Discussed food is fuel and what happens when the body doesn't get enough fuel.  Explained how many of her symptoms could be attributed to malnutrition.  She was surprised that she is malnourished, but pleased that she could feel better with improved nutrition.  Recommended 3 meals and 3 snacks to minimize GI distress.  Recommended fat/protein with all meals and snacks and to increase energy-dense condiments and beverages. Recommended increased total fluids and Culturelle, as needed, for GI distress  Teaching Method Utilized: Auditory   Barriers to learning/adherence to lifestyle change: none  Demonstrated degree of understanding via:  Teach Back   Monitoring/Evaluation:  Dietary intake, exercise, and body weight in 1 month(s).

## 2016-03-31 ENCOUNTER — Ambulatory Visit: Payer: Self-pay | Admitting: *Deleted

## 2016-04-13 ENCOUNTER — Encounter: Payer: Self-pay | Attending: Neurology | Admitting: *Deleted

## 2016-04-13 DIAGNOSIS — R636 Underweight: Secondary | ICD-10-CM | POA: Insufficient documentation

## 2016-04-13 DIAGNOSIS — Z713 Dietary counseling and surveillance: Secondary | ICD-10-CM | POA: Insufficient documentation

## 2016-04-13 NOTE — Progress Notes (Signed)
Medical Nutrition Therapy:  Appt start time: 1400 end time:  1430  Assessment:  Primary concerns today: Kayla Hardy is here for follow up nutrition counseling pertaining to referral for underweight status.    Struggling with constipation.  She is pooping more than previously, but still a struggle. Has not taken anything for it.   She is really pleased with her progress with weight gain She has been trying really hard to eat more since last visit.  Boyfriend is really supportive She is eating larger portions and is eating more throughout the day She has been struggling so hard with GI distress    Positive for cold intolerance - still, but not as bad Positive for dizziness/lightheadedness LMP 03/20/16.  Cycles irregular Positive for change in hair texture/dry.  Weak fingernails - hair improving.   Headaches constantly (but improving with seizure medication) - improving Poor energy level-  improving Potential depression: has been referred to psychiatrist Constipation: BM every week.  This is a long standing issue Increased irritability/mood lability  No difficulty chewing or swallowing   Preferred Learning Style:  No preference indicated   Learning Readiness:   Change in progress  MEDICATIONS: see list   DIETARY INTAKE:  Usual eating pattern includes 3 meals and 3-4 snacks per day. Avoided foods include none (doesn't like brussel sprouts).    24-hr recall:  B: 2 toaster streudels and cantaloupe L: pasta salad and chicken sandwich and pistachios D: meatloaf, cupcake, mashed potatoes, green bean casserole S: cantaloupe Beverages: Bai coconut beverages, water, sometimes soda, tea  B: yogurt S: peanuts, little debbie L: leftover meatloaf, casserole, and mashed potatoes  Usual physical activity: ADLs  Estimated energy intake: 1600+ kcal  Estimated energy needs: 2200+ calories 275 g carbohydrates 110 g protein 73 g fat    Nutritional Diagnosis:  NI-1.4 Inadequate  energy intake As related to decreased appetite from anti-epileptic medications.  As evidenced by ~30 lb weight loss over 3 years.    Intervention:  Nutrition counseling provided.  Praised progress!! Keep it up. Addressed GI distress.  Suggested blood work for increased bruising and general wellness Ok for yoga or light walking 30 minutes 3 days/week   Teaching Method Utilized: Auditory   Barriers to learning/adherence to lifestyle change: none  Demonstrated degree of understanding via:  Teach Back   Monitoring/Evaluation:  Dietary intake, exercise, and body weight in 6 month(s).

## 2016-04-13 NOTE — Patient Instructions (Signed)
Keep up awesome progress!! Aim for 3 meals and 3 snacks Call [facility] [address] [place], [place] [address]  [phone]     for blood work Try either soy or Lactaid milk or Lactaid tablets with milk products Try Miralax for constipation Try Culturelle if it's in your budget.  Otherwise try yogurt Keep an eye on bruises.  See what blood work does.  Keep taking multivitamin Ok to do yoga or light walking 30 minutes 3 times/week

## 2016-06-06 ENCOUNTER — Encounter: Payer: Self-pay | Admitting: Neurology

## 2016-06-06 ENCOUNTER — Ambulatory Visit (INDEPENDENT_AMBULATORY_CARE_PROVIDER_SITE_OTHER): Payer: Self-pay | Admitting: Neurology

## 2016-06-06 VITALS — BP 108/62 | HR 83 | Temp 98.5°F | Ht 63.0 in | Wt 106.1 lb

## 2016-06-06 DIAGNOSIS — F32A Depression, unspecified: Secondary | ICD-10-CM

## 2016-06-06 DIAGNOSIS — G40209 Localization-related (focal) (partial) symptomatic epilepsy and epileptic syndromes with complex partial seizures, not intractable, without status epilepticus: Secondary | ICD-10-CM

## 2016-06-06 DIAGNOSIS — F329 Major depressive disorder, single episode, unspecified: Secondary | ICD-10-CM

## 2016-06-06 MED ORDER — CITALOPRAM HYDROBROMIDE 40 MG PO TABS: 40.0000 mg | ORAL_TABLET | Freq: Every day | ORAL | 11 refills | Status: DC

## 2016-06-06 MED ORDER — LAMOTRIGINE 100 MG PO TABS: ORAL_TABLET | ORAL | 3 refills | Status: DC

## 2016-06-06 NOTE — Progress Notes (Signed)
NEUROLOGY FOLLOW UP OFFICE NOTE  Kayla Hardy [id number]  HISTORY OF PRESENT ILLNESS: I had the pleasure of seeing Kayla Hardy in follow-up in the neurology clinic on 06/06/2016.  The patient was last seen 3 months ago for a diagnosis of seizures. On her last visit, she was tapered off Zonisamide and Lamotrigine was increased to 100mg  BID. She denies any seizures since June 2017. She is overall tolerating Lamotrigine without side effects. She reports her depression is worse, when she is up she is really up, but when she is down, she is really down and it is hard to get back up. She denies any suicidal ideation. She is taking Celexa 20mg  daily and feels it is not working anymore. She was referred to Psychiatry on her last visit but had not heard from them. She has occasional tingling in both hands. She denies any dizziness, diplopia, staring/unresponsive episodes, gaps in time, no falls. The headaches have improved some since she started seeing the nutritionist, she is happy to report she has gained some weight. Last headache was a week ago.   HPI 11/26/15: This is a 25 yo RH woman with a history of anxiety and seizures. Records from [facility] were reviewed. She started having episodes in July 2012 after returning from a cruise to the [place]. She was feeling unwell but went to work, then woke up in the hospital. After waking up, she was stuttering, numb on the right side, with cramping in the right hand and foot and severe headache. There is report of imaging and EEG done at that time. After coming home, she began having seizures that initially occurred every other day then slowed down. Per records, she had a seizure-free interval for 6 months in 2014. Since then, she reported a seizure once a week. She had seen 3 neurologists until she went to [facility], all of which had told her seizures were stress-related. She describes the seizures as starting with getting nervous and  anxious, followed by a metallic taste lasting seconds to all day. She would then start having numbness and tingling in her right arm, then becomes unconscious. Sometimes she has episodes where she is "there but trapped in my body," she knows what is going on and can hear and comprehend, but could not control it. She would feel confused and weak after. She has been told she has times where she would have shaking all over and "flopping." She reports having urinary incontinence and tongue bite a couple of times with the seizures.During her 3-day video EEG monitoring stay at [facility], wake and sleep EEG was normal. There were 5 events captured consisting of an aura of pain or abnormal sensation in the head, then a scared look and at times variable degrees of stiffening of the arms and upper trunk with tachycardia that had no epileptiform correlates. It was felt that the stereotypical nature of the events and short duration raised concern for possible seizures with a deep focus vs panic attacks vs less likely PNES. Based on the clinical and electrographic data, the patient's seizures are concerning for possible epileptic events with a deep seated focus precluding the expression of an epiletiform correlate on scalp EEG. She was discharged home on Topamax but had side effects of weight loss and paresthesias. She has tried several AEDs with various side effects, Vimpat caused tongue itching and vomiting, making her feel "zombie-fied" and groggy. Depakote made her groggy. Trileptal made her feel dizzy and sedated. She had  a rash a week after taking Keppra, but is unsure if rash was due to other cause. She was started on Zonisamide a year ago, instructed to take 3 capsules daily but this again caused her to feel "very zombie-ish." She takes 200mg  daily and reports weight loss, currently at 95 lbs.   She called our office last 01/11/16 to report 4 seizures in one day while at work, each lasting 1 minute but occurring back to  back. Co-worker laid her on the couch but she apparently rolled over shaking and hit her head. There was associated urinary incontinence, no tongue bite. She was advised to increase Lamotrigine to 75mg  BID and continue Zonisamide 200mg  qhs. Since then she had only a "little" seizure in June. Zonegran was tapered off, and Lamictal increased to 100mg  BID.   She reports that since having the seizures, she has also been having more headaches. She would have worsening headaches 2-3 days before a seizure. She reports headaches and dizziness on a daily basis, with sharp pains in the back of her head or bilateral temporal regions. She feels nauseated and dizzy, "almost like I am drunk" with tunnel vision. Taking a nap helps. She takes Tylenol every 4 hours.   She is currently living with her boyfriend. 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 She denies any stress but was recently put back on Celexa at [facility] by her neurologist.   Diagnostic Data: I personally reviewed MRI brain with and without contrast done 12/10/15 which was normal, hippocampi symmetric with no abnormal signal or enhancement.  Epilepsy Risk Factors: She had a normal birth and early development. There is no history of febrile convulsions, CNS infections such as meningitis/encephalitis, significant traumatic brain injury, neurosurgical procedures, or family history of seizures.  Prior AEDs: Topamax, Vimpat, Depakote, Trileptal, Keppra, Zonisamide    PAST MEDICAL HISTORY: Past Medical History:  Diagnosis Date  . Anemia    as child  . Anxiety   . Depression   . Hormone disorder   . 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 Pseudoseizure   . STD (sexually transmitted disease)    gonorrhea    MEDICATIONS: Current Outpatient Prescriptions on File Prior to Visit  Medication Sig Dispense Refill  . citalopram (CELEXA) 10 MG tablet daily. Take 2  tablets every evening    . folic acid (FOLVITE) 1 MG tablet Take 1 tablet (1 mg total) by mouth daily. 30 tablet 5  . ibuprofen (ADVIL,MOTRIN) 400 MG tablet Take 1 tablet (400 mg total) by mouth every 6 (six) hours as needed. 30 tablet 0  . lamoTRIgine (LAMICTAL) 100 MG tablet Take 1 tablet twice a day 60 tablet 6  . penicillin v potassium (VEETID) 500 MG tablet Take 500 mg by mouth 2 (two) times daily. for 10 days  0   No current facility-administered medications on file prior to visit.     ALLERGIES: Allergies  Allergen Reactions  . Demerol Anaphylaxis    Pt breaks out in red rash on her chest, neck and face.  . Eggs Or Egg-Derived Products     Makes her sick  . Lactaid [Lactase] Diarrhea    unknown  . Shellfish Allergy Other (See Comments)    Makes her "sick"  . Keppra [Levetiracetam] Rash    FAMILY HISTORY: Family History  Problem Relation Age of Onset  . Colon cancer Neg Hx   . Seizures Neg Hx   .  Diabetes Mellitus II Father   . Stroke Father     x4  . Hypertension Father   . Thyroid disease Father   . Diabetes Mellitus II Sister   . Thyroid disease Sister   . Thyroid disease Brother   . Autism Brother   . Thyroid disease Brother   . Autism Brother     SOCIAL HISTORY: Social History   Social History  . Marital status: Single    Spouse name: N/A  . Number of children: 0  . Years of education: some coll.   Occupational History  . [employer]    Social History Main Topics  . Smoking status: Former Smoker    Packs/day: 1.00    Types: Cigarettes    Quit date: 08/19/2015  . Smokeless tobacco: Never Used  . Alcohol use No  . Drug use: No  . Sexual activity: Yes    Partners: Male    Birth control/ protection: Condom   Other Topics Concern  . Not on file   Social History Narrative   Patient drinks 1-2 cups of caffeine daily.   Patient is right handed.    REVIEW OF SYSTEMS: Constitutional: No fevers, chills, or sweats, no generalized fatigue,  change in appetite Eyes: No visual changes, double vision, eye pain Ear, nose and throat: No hearing loss, ear pain, nasal congestion, sore throat Cardiovascular: No chest pain, palpitations Respiratory:  No shortness of breath at rest or with exertion, wheezes GastrointestinaI: No nausea, vomiting, diarrhea, abdominal pain, fecal incontinence Genitourinary:  No dysuria, urinary retention or frequency Musculoskeletal:  No neck pain, back pain Integumentary: No rash, pruritus, skin lesions Neurological: as above Psychiatric: + depression, insomnia, anxiety Endocrine: No palpitations, fatigue, diaphoresis, mood swings, change in appetite, change in weight, increased thirst Hematologic/Lymphatic:  No anemia, purpura, petechiae. Allergic/Immunologic: no itchy/runny eyes, nasal congestion, recent allergic reactions, rashes  PHYSICAL EXAM: Vitals:   06/06/16 1346  BP: 108/62  Pulse: 83  Temp: 98.5 F (36.9 C)   General: No acute distress Head:  Normocephalic/atraumatic Neck: supple, no paraspinal tenderness, full range of motion Heart:  Regular rate and rhythm Lungs:  Clear to auscultation bilaterally Back: No paraspinal tenderness Skin/Extremities: No rash, no edema Neurological Exam: alert and oriented to person, place, and time. No aphasia or dysarthria. Fund of knowledge is appropriate.  Recent and remote memory are intact.  Attention and concentration are normal.    Able to name objects and repeat phrases. Cranial nerves: Pupils equal, round, reactive to light.  Extraocular movements intact with no nystagmus. Visual fields full. Facial sensation intact. No facial asymmetry. Tongue, uvula, palate midline.  Motor: Bulk and tone normal, muscle strength 5/5 throughout with no pronator drift.  Sensation to light touch intact.  No extinction to double simultaneous stimulation.  Deep tendon reflexes +2 throughout, toes downgoing.  Finger to nose testing intact.  Gait narrow-based and steady,  able to tandem walk adequately.  Romberg negative.  IMPRESSION: This is a 25 yo RH woman with a history of anxiety and seizures. Although some of her seizures are concerning for possible non-epileptic events (PNES), records from [facility] were reviewed and although EEG was normal throughout her stay, episodes captured were felt to be stereotypical in nature and of short duration, raising concern for possible seizures with a deep seated focus precluding the expression of an epiletiform correlate on scalp EEG focus vs panic attacks vs less likely PNES. MRI brain normal. She was discharged on antiepileptic medication, however has been intolerant  of several AEDs. She had weight loss and continued seizures once a week on Zonisamide and was started on Lamotrigine. She is doing better with no seizures since June, on Lamotrigine 100mg  BID. She reports depression is worse, increase citalopram to 40mg  daily and re-send referral to Psychiatry. She denies any suicidal ideation. She has no pregnancy plans at this time and continues on folic acid. She is aware of [place] driving laws to stop driving after a seizure, until 6 months seizure-free. She will follow-up in 4 months and knows to call for any changes.   Thank you for allowing me to participate in her care.  Please do not hesitate to call for any questions or concerns.  The duration of this appointment visit was 15 minutes of face-to-face time with the patient.  Greater than 50% of this time was spent in counseling, explanation of diagnosis, planning of further management, and coordination of care.   Patrcia Dolly, M.D.   CC: Lenise Herald, PA-C

## 2016-06-06 NOTE — Patient Instructions (Signed)
1. Continue Lamotrigine 100mg  twice a day 2. Increase Citalopram to 40mg  daily 3. Refer to Psychiatry for depression 4. Follow-up in 4 months, call for any changes

## 2016-06-21 ENCOUNTER — Telehealth (HOSPITAL_COMMUNITY): Payer: Self-pay | Admitting: *Deleted

## 2016-06-21 NOTE — Telephone Encounter (Signed)
Called pt to sch new pt appt due to ref received from Dr. Patrcia DollyKaren Aquino office. lmtcb on number provided on ref form and office number provided.

## 2016-09-22 ENCOUNTER — Telehealth: Payer: Self-pay | Admitting: Neurology

## 2016-09-22 ENCOUNTER — Other Ambulatory Visit: Payer: Self-pay

## 2016-09-22 MED ORDER — PREDNISONE 20 MG PO TABS: ORAL_TABLET | ORAL | 0 refills | Status: DC

## 2016-09-22 NOTE — Telephone Encounter (Signed)
Patient states she has not tried Prednisone. RX sent as below.

## 2016-09-22 NOTE — Telephone Encounter (Signed)
Please advise 

## 2016-09-22 NOTE — Telephone Encounter (Signed)
Has she been on steroids in the past to help break a migraine cycle? If ok with her, pls Rx Prednisone 20mg : Take 3 tabs on day 1, 2-1/2 tabs on day 2, 2 tabs on day 3, 1-1/2 tabs on day 4, 1 tab on day 5, 1/2 tab on days 6 and 7, then stop. #11 tabs no refills

## 2016-09-22 NOTE — Telephone Encounter (Signed)
Kayla Hardy [birth year]. Her # [phone]. She uses [facility] [place], [place]. She has had a Migraine for a few days now. She would like to have something called in for her. Thank you

## 2016-10-16 ENCOUNTER — Ambulatory Visit: Payer: Self-pay | Admitting: Neurology

## 2016-10-16 DIAGNOSIS — Z029 Encounter for administrative examinations, unspecified: Secondary | ICD-10-CM

## 2016-10-20 ENCOUNTER — Encounter: Payer: Self-pay | Admitting: Neurology

## 2016-12-26 ENCOUNTER — Telehealth: Payer: Self-pay | Admitting: Neurology

## 2016-12-26 NOTE — Telephone Encounter (Signed)
PT called and said she was in the hospital on Sunday and they her a prescription for Keppra, please call her back and let her know if she should take it with all her other medicine she is taking/Dawn

## 2016-12-27 NOTE — Telephone Encounter (Signed)
LMOM asking pt to return my call. °

## 2016-12-27 NOTE — Telephone Encounter (Signed)
Patient went to [facility] 12/24/2016.  Patient was at work didn't feel the greatest, dizzy, metal taste in mouth.  Patient woke up to EMS, patient was told she had 8 seizures.  She says they gave her keppra at the hospital.  Patient denies missing lamotrigine dose.  Patient recently started birth control tricyclinlo?  Patient has mucous and congestion and says she is not sleeping well.  Patient is taking melatonin to help sleep.  Patient took keppra yesturday and today.  Advised patient not to take Keppra.  Per Dr Karel JarvisAquino patient should take 150mg  of lamictal twice a day.  Patient will work on getting medical records sent from hospital visit.  Patient understands.

## 2016-12-27 NOTE — Telephone Encounter (Signed)
Pls ask which hospital she went to and request records. Would not start the Keppra. What are the events of the seizure, did she miss any Lamotrigine doses, was she sick, any sleep deprivation that could have provoked it? Thanks

## 2017-01-23 ENCOUNTER — Encounter: Payer: Self-pay | Admitting: Neurology

## 2017-01-23 ENCOUNTER — Ambulatory Visit (INDEPENDENT_AMBULATORY_CARE_PROVIDER_SITE_OTHER): Payer: Self-pay | Admitting: Neurology

## 2017-01-23 VITALS — BP 108/62 | HR 92 | Ht 64.0 in | Wt 115.0 lb

## 2017-01-23 DIAGNOSIS — M542 Cervicalgia: Secondary | ICD-10-CM

## 2017-01-23 DIAGNOSIS — G40009 Localization-related (focal) (partial) idiopathic epilepsy and epileptic syndromes with seizures of localized onset, not intractable, without status epilepticus: Secondary | ICD-10-CM

## 2017-01-23 MED ORDER — LAMOTRIGINE 100 MG PO TABS: ORAL_TABLET | ORAL | 3 refills | Status: DC

## 2017-01-23 MED ORDER — TIZANIDINE HCL 2 MG PO TABS: 2.0000 mg | ORAL_TABLET | Freq: Three times a day (TID) | ORAL | 0 refills | Status: DC | PRN

## 2017-01-23 NOTE — Progress Notes (Signed)
NEUROLOGY FOLLOW UP OFFICE NOTE  Kayla Hardy [id number]  HISTORY OF PRESENT ILLNESS: I had the pleasure of seeing Kayla Hardy in follow-up in the neurology clinic on 01/23/2017.  The patient was last seen 8 months ago for a diagnosis of seizures. Since her last visit, she had called our office in February to report several days of migraine and was given a week course of Prednisone. Last 12/24/16, she called our office to report having several seizures in one day. Prior to this, her last seizure was in June 2017. She was not feeling good that day with nasal congestion and not sleeping well. She suddenly felt sick and dizzy, with a metal taste in her mouth. She woke up to EMS around her. She was told she had convulsions and had 8 seizures. Lamotrigine dose was increased to 150mg  BID. She was also found to have MRSA on right leg wounds, treated with antibiotics. She states that 2 weeks prior to the seizure, she started a different birth control to regulate her period. She denies any further seizures in the past month. She feels dizzy now and then when standing, with some blurred vision. This seems to be happening more. She is only getting a total of 4 hours of sleep, waking up every 2 hours. She feels she sleeps better during the day, with no energy. She takes melatonin 5mg  qhs. She continues to endorse depression, citalopram was increased to 40mg  on her last visit. She has not yet seen Behavioral Health.   HPI 11/26/15: This is a 26 yo RH woman with a history of anxiety and seizures. Records from [facility] and [facility] were reviewed. She started having episodes in July 2012 after returning from a cruise to the [place]. She was feeling unwell but went to work, then woke up in the hospital. After waking up, she was stuttering, numb on the right side, with cramping in the right hand and foot and severe headache. There is report of imaging and EEG done at that time. After coming home, she  began having seizures that initially occurred every other day then slowed down. Per records, she had a seizure-free interval for 6 months in 2014. Since then, she reported a seizure once a week. She had seen 3 neurologists until she went to [facility], all of which had told her seizures were stress-related. She describes the seizures as starting with getting nervous and anxious, followed by a metallic taste lasting seconds to all day. She would then start having numbness and tingling in her right arm, then becomes unconscious. Sometimes she has episodes where she is "there but trapped in my body," she knows what is going on and can hear and comprehend, but could not control it. She would feel confused and weak after. She has been told she has times where she would have shaking all over and "flopping." She reports having urinary incontinence and tongue bite a couple of times with the seizures.During her 3-day video EEG monitoring stay at [facility], wake and sleep EEG was normal. There were 5 events captured consisting of an aura of pain or abnormal sensation in the head, then a scared look and at times variable degrees of stiffening of the arms and upper trunk with tachycardia that had no epileptiform correlates. It was felt that the stereotypical nature of the events and short duration raised concern for possible seizures with a deep focus vs panic attacks vs less likely PNES. Based on the clinical and electrographic data, the  patient's seizures are concerning for possible epileptic events with a deep seated focus precluding the expression of an epiletiform correlate on scalp EEG. She was discharged home on Topamax but had side effects of weight loss and paresthesias. She has tried several AEDs with various side effects, Vimpat caused tongue itching and vomiting, making her feel "zombie-fied" and groggy. Depakote made her groggy. Trileptal made her feel dizzy and sedated. She had a rash a week after taking Keppra, but is  unsure if rash was due to other cause. She was started on Zonisamide a year ago, instructed to take 3 capsules daily but this again caused her to feel "very zombie-ish." She takes 200mg  daily and reports weight loss, currently at 95 lbs.   She called our office last 01/11/16 to report 4 seizures in one day while at work, each lasting 1 minute but occurring back to back. Co-worker laid her on the couch but she apparently rolled over shaking and hit her head. There was associated urinary incontinence, no tongue bite. She was advised to increase Lamotrigine to 75mg  BID and continue Zonisamide 200mg  qhs. Since then she had only a "little" seizure in June. Zonegran was tapered off, and Lamictal increased to 100mg  BID.   She reports that since having the seizures, she has also been having more headaches. She would have worsening headaches 2-3 days before a seizure. She reports headaches and dizziness on a daily basis, with sharp pains in the back of her head or bilateral temporal regions. She feels nauseated and dizzy, "almost like I am drunk" with tunnel vision. Taking a nap helps. She takes Tylenol every 4 hours.   She is currently living with her boyfriend. 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 She denies any stress but [facility] by [facility].   Diagnostic Data: I personally reviewed MRI brain with and without contrast done 12/10/15 which was normal, hippocampi symmetric with no abnormal signal or enhancement.  Epilepsy Risk Factors: She had a normal birth and early development. There is no history of febrile convulsions, CNS infections such as meningitis/encephalitis, significant traumatic brain injury, neurosurgical procedures, or family history of seizures.  Prior AEDs: Topamax, Vimpat, Depakote, Trileptal, Keppra, Zonisamide    PAST MEDICAL HISTORY: Past Medical History:  Diagnosis Date  . Anemia    as child    . Anxiety   . Depression   . Hormone disorder   . 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 Pseudoseizure   . STD (sexually transmitted disease)    gonorrhea    MEDICATIONS:  Outpatient Encounter Prescriptions as of 01/23/2017  Medication Sig Note  . citalopram (CELEXA) 40 MG tablet Take 1 tablet (40 mg total) by mouth daily.   Marland Kitchen. ibuprofen (ADVIL,MOTRIN) 400 MG tablet Take 1 tablet (400 mg total) by mouth every 6 (six) hours as needed.   . lamoTRIgine (LAMICTAL) 100 MG tablet Take 1 tablet twice a day   . Melatonin 5 MG CAPS Take by mouth.   . norgestimate-ethinyl estradiol (ORTHO-CYCLEN,SPRINTEC,PREVIFEM) 0.25-35 MG-MCG tablet Take 1 tablet by mouth daily.   . [DISCONTINUED] folic acid (FOLVITE) 1 MG tablet Take 1 tablet (1 mg total) by mouth daily.   . [DISCONTINUED] penicillin v potassium (VEETID) 500 MG tablet Take 500 mg by mouth 2 (two) times daily. for 10 days 03/06/2016: Received from: External Pharmacy  . [DISCONTINUED] predniSONE (DELTASONE) 20 MG tablet Take 3  tabs on day 1, 2-1/2 tabs on day 2, 2 tabs on day 3, 1-1/2 tabs on day 4, 1 tab on day 5, 1/2 tab on days 6 and 7, then stop.    No facility-administered encounter medications on file as of 01/23/2017.     ALLERGIES: Allergies  Allergen Reactions  . Demerol Anaphylaxis    Pt breaks out in red rash on her chest, neck and face.  . Eggs Or Egg-Derived Products     Makes her sick  . Lactaid [Lactase] Diarrhea    unknown  . Shellfish Allergy Other (See Comments)    Makes her "sick"  . Keppra [Levetiracetam] Rash    FAMILY HISTORY: Family History  Problem Relation Age of Onset  . Diabetes Mellitus II Father   . Stroke Father        x4  . Hypertension Father   . Thyroid disease Father   . Diabetes Mellitus II Sister   . Thyroid disease Sister   . Thyroid disease Brother   . Autism Brother   . Thyroid disease Brother   . Autism Brother   . Colon cancer Neg Hx   .  Seizures Neg Hx     SOCIAL HISTORY: Social History   Social History  . Marital status: Single    Spouse name: N/A  . Number of children: 0  . Years of education: some coll.   Occupational History  . [employer]    Social History Main Topics  . Smoking status: Former Smoker    Packs/day: 1.00    Types: Cigarettes    Quit date: 08/19/2015  . Smokeless tobacco: Never Used  . Alcohol use No  . Drug use: No  . Sexual activity: Yes    Partners: Male    Birth control/ protection: Condom   Other Topics Concern  . Not on file   Social History Narrative   Patient drinks 1-2 cups of caffeine daily.   Patient is right handed.    REVIEW OF SYSTEMS: Constitutional: No fevers, chills, or sweats, no generalized fatigue, change in appetite Eyes: No visual changes, double vision, eye pain Ear, nose and throat: No hearing loss, ear pain, nasal congestion, sore throat Cardiovascular: No chest pain, palpitations Respiratory:  No shortness of breath at rest or with exertion, wheezes GastrointestinaI: No nausea, vomiting, diarrhea, abdominal pain, fecal incontinence Genitourinary:  No dysuria, urinary retention or frequency Musculoskeletal:  + neck pain, back pain Integumentary: No rash, pruritus, skin lesions Neurological: as above Psychiatric: + depression, insomnia, anxiety Endocrine: No palpitations, fatigue, diaphoresis, mood swings, change in appetite, change in weight, increased thirst Hematologic/Lymphatic:  No anemia, purpura, petechiae. Allergic/Immunologic: no itchy/runny eyes, nasal congestion, recent allergic reactions, rashes  PHYSICAL EXAM: Vitals:   01/23/17 1017  BP: 108/62  Pulse: 92   General: No acute distress Head:  Normocephalic/atraumatic Neck: supple, + paraspinal tenderness, full range of motion Heart:  Regular rate and rhythm Lungs:  Clear to auscultation bilaterally Back: No paraspinal tenderness Skin/Extremities: No rash, no edema Neurological  Exam: alert and oriented to person, place, and time. No aphasia or dysarthria. Fund of knowledge is appropriate.  Recent and remote memory are intact.  Attention and concentration are normal.    Able to name objects and repeat phrases. Cranial nerves: Pupils equal, round, reactive to light.  Extraocular movements intact with no nystagmus. Visual fields full. Facial sensation intact. No facial asymmetry. Tongue, uvula, palate midline.  Motor: Bulk and tone normal, muscle strength 5/5 throughout with  no pronator drift.  Sensation to light touch intact.  No extinction to double simultaneous stimulation.  Deep tendon reflexes +2 throughout, toes downgoing.  Finger to nose testing intact.  Gait narrow-based and steady, able to tandem walk adequately.  Romberg negative.  IMPRESSION: This is a 26 yo RH woman with a history of anxiety and seizures. Although some of her seizures are concerning for possible non-epileptic events (PNES), records from [facility] were reviewed and although EEG was normal throughout her stay, episodes captured were felt to be stereotypical in nature and of short duration, raising concern for possible seizures with a deep seated focus precluding the expression of an epiletiform correlate on scalp EEG focus vs panic attacks vs less likely PNES. MRI brain normal. She was discharged on antiepileptic medication, however has been intolerant of several AEDs. She had weight loss and continued seizures once a week on Zonisamide and was started on Lamotrigine. She was doing well seizure-free for almost a year until she reports having 8 seizures in one day last 12/24/16, likely triggered by illness, sleep deprivation, and possibly new birth control which can occasionally lower Lamotrigine levels. She is now on Lamotrigine 150mg  BID with no significant side effects. Baseline Lamotrigine level will be done once insurance issues settle. She is having neck pain and will try tizanidine 2mg  q8hrs prn. She continues to  report depression, and was given information for contacting [facility] for psychiatry services. She denies any suicidal ideation. She has no pregnancy plans at this time and continues on folic acid. She is aware of [place] driving laws to stop driving after a seizure, until 6 months seizure-free. She will follow-up in 4 months and knows to call for any changes.   Thank you for allowing me to participate in her care.  Please do not hesitate to call for any questions or concerns.  The duration of this appointment visit was 25 minutes of face-to-face time with the patient.  Greater than 50% of this time was spent in counseling, explanation of diagnosis, planning of further management, and coordination of care.   Patrcia Dolly, M.D.   CC: Lenise Herald, PA-C

## 2017-01-23 NOTE — Patient Instructions (Addendum)
1. Continue Lamotrigine 150mg  twice a day 2. Take Tizanidine 2mg  every 8 hours as needed for neck pain 3. Contact [facility] setting up visit for depression and insomnia 4. Have Lamotrigine level done once you hear from [facility] 5. Follow-up in 4 months, call for any changes

## 2017-03-15 ENCOUNTER — Telehealth: Payer: Self-pay | Admitting: Neurology

## 2017-03-15 NOTE — Telephone Encounter (Signed)
Patient called to say that she had a Seizure last week and that she has been having headaches since. Please call. Thanks

## 2017-03-16 ENCOUNTER — Telehealth: Payer: Self-pay | Admitting: Neurology

## 2017-03-16 ENCOUNTER — Telehealth: Payer: Self-pay

## 2017-03-16 NOTE — Telephone Encounter (Signed)
ERROR

## 2017-03-16 NOTE — Telephone Encounter (Signed)
Patient having depression and would like to know who she can go to for this. She has no insurance and needs options.  Please call.

## 2017-03-16 NOTE — Telephone Encounter (Signed)
Spoke with pt.  She states that she had a seizure last week which she contributes to missing one dose of her Lamictal.  She has been experiencing a consistant headache ever since.  States that she feels like she is I a fog.  She was unable to get out of bed for 4 days due to the headache.  She has taken OTC Ibuprofen and Excedrin Migraine.  Has even taken Ibuprofen 800 (left over from non-related injury) to no avail. She also states that she is not sleeping well, which she believes might be contributing to headache.  I let her know that I would send Dr. Karel JarvisAquino a message for recommendations but Dr. Karel JarvisAquino is out of the office until Monday.  I advised her to go to the ER if headache gets worse to receive headache cocktail.

## 2017-03-16 NOTE — Telephone Encounter (Signed)
Returned pt's call.   No answer.  LMOM asking pt to return call to the office. 

## 2017-03-16 NOTE — Telephone Encounter (Signed)
Pt called again about the headaches.  Please call.

## 2017-03-19 NOTE — Telephone Encounter (Signed)
LMOM asking pt to return my call to get information and relay message below

## 2017-03-19 NOTE — Telephone Encounter (Signed)
Pls see how she is doing, if still having a headache, would do the week course of Prednisone again like we did in Feb. If she is agreeable, send Rx for Prednisone 20mg : Take 3 tabs on day 1, 2-1/2 tabs on day 2, 2 tabs on day 3, 1-1/2 tabs on day 4, 1 tab on day 5, 1/2 tab on days 6 and 7, then stop. #11 tabs no refills

## 2017-03-21 ENCOUNTER — Telehealth: Payer: Self-pay | Admitting: Neurology

## 2017-03-21 MED ORDER — PREDNISONE 20 MG PO TABS: ORAL_TABLET | ORAL | 0 refills | Status: DC

## 2017-03-21 NOTE — Addendum Note (Signed)
Addended by: Horatio PelOSTELLO, Lucan Riner on: 03/21/2017 11:21 AM   Modules accepted: Orders

## 2017-03-21 NOTE — Telephone Encounter (Signed)
Spoke with pt, relaying message below.  Will send below Rx to pharmacy

## 2017-03-21 NOTE — Telephone Encounter (Signed)
She called regarding her having a Seizure week before last. She said she cannot feel better. She also said that she has missed about 7 days of work. She feels Dizzy constantly and she said Dr. Karel JarvisAquino had called her in Prednisone, should she take that? Please Advise. Thanks

## 2017-03-22 NOTE — Telephone Encounter (Signed)
Spoke with pt.  She states that [facility] is [facility] right now.  She just needed to get some relief.  She has not started the Prednisone.  I asked that she call our office with an update.

## 2017-05-30 ENCOUNTER — Ambulatory Visit: Payer: Self-pay | Admitting: Neurology

## 2017-06-01 ENCOUNTER — Ambulatory Visit (INDEPENDENT_AMBULATORY_CARE_PROVIDER_SITE_OTHER): Payer: Self-pay | Admitting: Neurology

## 2017-06-01 ENCOUNTER — Encounter: Payer: Self-pay | Admitting: Neurology

## 2017-06-01 VITALS — BP 112/60 | HR 96 | Ht 64.0 in | Wt 123.0 lb

## 2017-06-01 DIAGNOSIS — F329 Major depressive disorder, single episode, unspecified: Secondary | ICD-10-CM

## 2017-06-01 DIAGNOSIS — F32A Depression, unspecified: Secondary | ICD-10-CM

## 2017-06-01 DIAGNOSIS — R519 Headache, unspecified: Secondary | ICD-10-CM

## 2017-06-01 DIAGNOSIS — R51 Headache: Secondary | ICD-10-CM

## 2017-06-01 DIAGNOSIS — G40009 Localization-related (focal) (partial) idiopathic epilepsy and epileptic syndromes with seizures of localized onset, not intractable, without status epilepticus: Secondary | ICD-10-CM

## 2017-06-01 MED ORDER — CITALOPRAM HYDROBROMIDE 40 MG PO TABS: 40.0000 mg | ORAL_TABLET | Freq: Every day | ORAL | 11 refills | Status: DC

## 2017-06-01 MED ORDER — LAMOTRIGINE 100 MG PO TABS: ORAL_TABLET | ORAL | 3 refills | Status: DC

## 2017-06-01 MED ORDER — NORTRIPTYLINE HCL 10 MG PO CAPS: ORAL_CAPSULE | ORAL | 11 refills | Status: DC

## 2017-06-01 NOTE — Progress Notes (Signed)
NEUROLOGY FOLLOW UP OFFICE NOTE  Kayla Hardy [id number]  HISTORY OF PRESENT ILLNESS: I had the pleasure of seeing Kayla Hardy in follow-up in the neurology clinic on 06/01/2017.  The patient was last seen 4 months ago for a diagnosis of seizures. She denies any seizures since May 2018, Lamictal dose was increased to  BID at that time. She denies any side effects. She presents today with other symptoms, including difficulty focusing, constant headaches, difficulty sleeping, and mood swings/anger. She has not been to [facility] yet. She is now taking classes and has noticed she cannot focus on schoolwork. Her mind is "all over the place." She feels like she is not paying attention. She does not have a diagnosis of ADHD, but feels like she did in school as a child, only worse. She is also having headaches on a daily basis. Pain is in the frontal region radiating to the back, with throbbing pain, sometimes she has a lot of diarrhea with the headaches. If she wakes up with a headache, it would last all day. No nausea/vomiting, she is sensitive to lights and sounds. She alternates Tylenol and Ibuprofen on a daily basis. She has difficulty sleeping, getting an average of 6 hours of sleep. She is drowsy and tired in the daytime, with low energy. She has also been very moody, she would be fine one minute, then the slightest thing can set her off into immediate anger. She denies any side effects on Citalopram  daily.   HPI 11/26/15: This is a 26 yo RH woman with a history of anxiety and seizures. Records from [facility] and [facility] were reviewed. She started having episodes in July 2012 after returning from a cruise to the [place]. She was feeling unwell but went to work, then woke up in the hospital. After waking up, she was stuttering, numb on the right side, with cramping in the right hand and foot and severe headache. There is report of imaging and EEG done at that time.  After coming home, she began having seizures that initially occurred every other day then slowed down. Per records, she had a seizure-free interval for 6 months in 2014. Since then, she reported a seizure once a week. She had seen 3 neurologists until she went to [facility], all of which had told her seizures were stress-related. She describes the seizures as starting with getting nervous and anxious, followed by a metallic taste lasting seconds to all day. She would then start having numbness and tingling in her right arm, then becomes unconscious. Sometimes she has episodes where she is "there but trapped in my body," she knows what is going on and can hear and comprehend, but could not control it. She would feel confused and weak after. She has been told she has times where she would have shaking all over and "flopping." She reports having urinary incontinence and tongue bite a couple of times with the seizures.During her 3-day video EEG monitoring stay at [facility], wake and sleep EEG was normal. There were 5 events captured consisting of an aura of pain or abnormal sensation in the head, then a scared look and at times variable degrees of stiffening of the arms and upper trunk with tachycardia that had no epileptiform correlates. It was felt that the stereotypical nature of the events and short duration raised concern for possible seizures with a deep focus vs panic attacks vs less likely PNES. Based on the clinical and electrographic data, the patient's seizures  are concerning for possible epileptic events with a deep seated focus precluding the expression of an epiletiform correlate on scalp EEG. She was discharged home on Topamax but had side effects of weight loss and paresthesias. She has tried several AEDs with various side effects, Vimpat caused tongue itching and vomiting, making her feel "zombie-fied" and groggy. Depakote made her groggy. Trileptal made her feel dizzy and sedated. She had a rash a week after  taking Keppra, but is unsure if rash was due to other cause. She was started on Zonisamide a year ago, instructed to take 3 capsules daily but this again caused her to feel "very zombie-ish." She takes  daily and reports weight loss, currently at 95 lbs.   She called our office last 01/11/16 to report 4 seizures in one day while at work, each lasting 1 minute but occurring back to back. Co-worker laid her on the couch but she apparently rolled over shaking and hit her head. There was associated urinary incontinence, no tongue bite. She was advised to increase Lamotrigine to  BID and continue Zonisamide  qhs. Since then she had only a "little" seizure in June. Zonegran was tapered off, and Lamictal increased to  BID.   Last 12/24/16, she called our office to report having several seizures in one day. Prior to this, her last seizure was in June 2017. She was not feeling good that day with nasal congestion and not sleeping well. She suddenly felt sick and dizzy, with a metal taste in her mouth.   She reports that since having the seizures, she has also been having more headaches. She would have worsening headaches 2-3 days before a seizure. She reports headaches and dizziness on a daily basis, with sharp pains in the back of her head or bilateral temporal regions. She feels nauseated and dizzy, "almost like I am drunk" with tunnel vision. Taking a nap helps. She takes Tylenol every 4 hours.   She is currently living with her boyfriend. 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 She denies any stress but was recently put back on Celexa at [facility] by her neurologist.   Diagnostic Data: I personally reviewed MRI brain with and without contrast done 12/10/15 which was normal, hippocampi symmetric with no abnormal signal or enhancement.  Epilepsy Risk Factors: She had a normal birth and early development. There is no history of febrile convulsions, CNS  infections such as meningitis/encephalitis, significant traumatic brain injury, neurosurgical procedures, or family history of seizures.  Prior AEDs: Topamax, Vimpat, Depakote, Trileptal, Keppra, Zonisamide    PAST MEDICAL HISTORY: Past Medical History:  Diagnosis Date  . Anemia    as child  . Anxiety   . Depression   . Hormone disorder   . 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 Pseudoseizure   . STD (sexually transmitted disease)    gonorrhea    MEDICATIONS:  Outpatient Encounter Prescriptions as of 06/01/2017  Medication Sig  . citalopram (CELEXA) 40 MG tablet Take 1 tablet (40 mg total) by mouth daily.  Marland Kitchen ibuprofen (ADVIL,MOTRIN) 400 MG tablet Take 1 tablet (400 mg total) by mouth every 6 (six) hours as needed.  . lamoTRIgine (LAMICTAL) 100 MG tablet Take 1.5 tablets twice a day  . Melatonin 5 MG CAPS Take by mouth.  . norgestimate-ethinyl estradiol (ORTHO-CYCLEN,SPRINTEC,PREVIFEM) 0.25-35 MG-MCG tablet Take 1 tablet by mouth daily.  . predniSONE (DELTASONE) 20 MG tablet Take 3 tabs on day  1, 2-1/2 tabs on day 2, 2 tabs on day 3, 1-1/2 tabs on day 4, 1 tab on day 5, 1/2 tab on days 6 and 7, then stop  . tiZANidine (ZANAFLEX) 2 MG tablet Take 1 tablet (2 mg total) by mouth every 8 (eight) hours as needed for muscle spasms.   No facility-administered encounter medications on file as of 06/01/2017.     ALLERGIES: Allergies  Allergen Reactions  . Demerol Anaphylaxis    Pt breaks out in red rash on her chest, neck and face.  . Eggs Or Egg-Derived Products     Makes her sick  . Lactaid [Lactase] Diarrhea    unknown  . Shellfish Allergy Other (See Comments)    Makes her "sick"  . Keppra [Levetiracetam] Rash    FAMILY HISTORY: Family History  Problem Relation Age of Onset  . Diabetes Mellitus II Father   . Stroke Father        x4  . Hypertension Father   . Thyroid disease Father   . Diabetes Mellitus II Sister   . Thyroid  disease Sister   . Thyroid disease Brother   . Autism Brother   . Thyroid disease Brother   . Autism Brother   . Colon cancer Neg Hx   . Seizures Neg Hx     SOCIAL HISTORY: Social History   Social History  . Marital status: Single    Spouse name: N/A  . Number of children: 0  . Years of education: some coll.   Occupational History  . [employer]    Social History Main Topics  . Smoking status: Former Smoker    Packs/day: 1.00    Types: Cigarettes    Quit date: 08/19/2015  . Smokeless tobacco: Never Used  . Alcohol use No  . Drug use: No  . Sexual activity: Yes    Partners: Male    Birth control/ protection: Condom   Other Topics Concern  . Not on file   Social History Narrative   Patient drinks 1-2 cups of caffeine daily.   Patient is right handed.    REVIEW OF SYSTEMS: Constitutional: No fevers, chills, or sweats, no generalized fatigue, change in appetite Eyes: No visual changes, double vision, eye pain Ear, nose and throat: No hearing loss, ear pain, nasal congestion, sore throat Cardiovascular: No chest pain, palpitations Respiratory:  No shortness of breath at rest or with exertion, wheezes GastrointestinaI: No nausea, vomiting, diarrhea, abdominal pain, fecal incontinence Genitourinary:  No dysuria, urinary retention or frequency Musculoskeletal:  + neck pain, back pain Integumentary: No rash, pruritus, skin lesions Neurological: as above Psychiatric: + depression, insomnia, anxiety Endocrine: No palpitations, fatigue, diaphoresis, mood swings, change in appetite, change in weight, increased thirst Hematologic/Lymphatic:  No anemia, purpura, petechiae. Allergic/Immunologic: no itchy/runny eyes, nasal congestion, recent allergic reactions, rashes  PHYSICAL EXAM: Vitals:   06/01/17 1304  BP: 112/60  Pulse: 96  SpO2: 97%   General: No acute distress Head:  Normocephalic/atraumatic Neck: supple, + paraspinal tenderness, full range of motion Heart:   Regular rate and rhythm Lungs:  Clear to auscultation bilaterally Back: No paraspinal tenderness Skin/Extremities: No rash, no edema Neurological Exam: alert and oriented to person, place, and time. No aphasia or dysarthria. Fund of knowledge is appropriate.  Recent and remote memory are intact.  Attention and concentration are normal.    Able to name objects and repeat phrases. Cranial nerves: Pupils equal, round, reactive to light.  Extraocular movements intact with no nystagmus. Visual  fields full. Facial sensation intact. No facial asymmetry. Tongue, uvula, palate midline.  Motor: Bulk and tone normal, muscle strength 5/5 throughout with no pronator drift.  Sensation to light touch intact.  No extinction to double simultaneous stimulation.  Deep tendon reflexes +2 throughout, toes downgoing.  Finger to nose testing intact.  Gait narrow-based and steady, able to tandem walk adequately.  Romberg negative.  IMPRESSION: This is a 26 yo RH woman with a history of anxiety and seizures. Although some of her seizures are concerning for possible non-epileptic events (PNES), records from [facility] were reviewed and although EEG was normal throughout her stay, episodes captured were felt to be stereotypical in nature and of short duration, raising concern for possible seizures with a deep seated focus precluding the expression of an epileptiform correlate on scalp EEG focus vs panic attacks vs less likely PNES. MRI brain normal. She was discharged on antiepileptic medication, however has been intolerant of several AEDs. She had weight loss and continued seizures once a week on Zonisamide and was started on Lamotrigine. She was doing well seizure-free for almost a year until she reports having 8 seizures in one day last 12/24/16, likely triggered by illness, sleep deprivation, and possibly new birth control which can occasionally lower Lamotrigine levels. No seizures since May 2018 on Lamotrigine  BID, no significant  side effects. She is reporting daily headaches, poor sleep, mood swings, and difficulty focusing. We will start a new headache preventative medication, nortriptyline, to hopefully help with headaches, sleep, and mood as well. Side effects were discussed, start  daily for 2 weeks, then increase to  daily. We may uptitrate as tolerated. She was advised to start minimize over the counter pain medication to 2-3 times a week to avoid rebound headaches. She was encouraged to see [facility] again to help with mood swings and difficulties focusing (?ADHD). She has no pregnancy plans at this time and continues on folic acid. She is aware of [place] driving laws to stop driving after a seizure, until 6 months seizure-free. She will follow-up in 3 months and knows to call for any changes.   Thank you for allowing me to participate in her care.  Please do not hesitate to call for any questions or concerns.  The duration of this appointment visit was 25 minutes of face-to-face time with the patient.  Greater than 50% of this time was spent in counseling, explanation of diagnosis, planning of further management, and coordination of care.   Patrcia Dolly, M.D.   CC: Lenise Herald, PA-C

## 2017-06-01 NOTE — Patient Instructions (Addendum)
1. Start nortriptyline : Take 1 cap at night for 2 weeks, then increase to 2 caps at night 2. Continue Lamictal  twice a day 3. Minimize Tylenol or Ibuprofen intake to 2-3 times a week, otherwise headaches will not get better 4. Try to get to [facility] for mood swings and focusing issues  [facility] is located at [address]  [place], [place] [address].  Please contact them at [phone] to schedule your initial visit.   5. Follow-up in 3 months, call for any changes  Seizure Precautions: 1. If medication has been prescribed for you to prevent seizures, take it exactly as directed.  Do not stop taking the medicine without talking to your doctor first, even if you have not had a seizure in a long time.   2. Avoid activities in which a seizure would cause danger to yourself or to others.  Don't operate dangerous machinery, swim alone, or climb in high or dangerous places, such as on ladders, roofs, or girders.  Do not drive unless your doctor says you may.  3. If you have any warning that you may have a seizure, lay down in a safe place where you can't hurt yourself.    4.  No driving for 6 months from last seizure, as per [facility].   Please refer to the following link on the [employer]ile[employer] of [employer]'s website for more information: http://www.epilepsyfoundation.org/answerplace/Social/driving/drivingu.cfm   5.  Maintain good sleep hygiene. Avoid alcohol.  6.  Notify your neurology if you are planning pregnancy or if you become pregnant.  7.  Contact your doctor if you have any problems that may be related to the medicine you are taking.  8.  Call 911 and bring the patient back to the ED if:        A.  The seizure lasts longer than 5 minutes.       B.  The patient doesn't awaken shortly after the seizure  C.  The patient has new problems such as difficulty seeing, speaking or moving  D.  The patient was injured during the seizure  E.  The patient has a temperature  over 102 F (39C)  F.  The patient vomited and now is having trouble breathing

## 2017-08-31 ENCOUNTER — Ambulatory Visit: Payer: Self-pay | Admitting: Neurology

## 2017-09-03 ENCOUNTER — Encounter: Payer: Self-pay | Admitting: Neurology

## 2017-09-03 ENCOUNTER — Ambulatory Visit (INDEPENDENT_AMBULATORY_CARE_PROVIDER_SITE_OTHER): Payer: Medicaid - Out of State | Admitting: Neurology

## 2017-09-03 VITALS — BP 112/62 | HR 106 | Ht 64.0 in | Wt 130.0 lb

## 2017-09-03 DIAGNOSIS — G40009 Localization-related (focal) (partial) idiopathic epilepsy and epileptic syndromes with seizures of localized onset, not intractable, without status epilepticus: Secondary | ICD-10-CM | POA: Diagnosis not present

## 2017-09-03 DIAGNOSIS — R519 Headache, unspecified: Secondary | ICD-10-CM

## 2017-09-03 DIAGNOSIS — R51 Headache: Secondary | ICD-10-CM | POA: Diagnosis not present

## 2017-09-03 MED ORDER — NORTRIPTYLINE HCL 10 MG PO CAPS: ORAL_CAPSULE | ORAL | 11 refills | Status: DC

## 2017-09-03 MED ORDER — LAMOTRIGINE 200 MG PO TABS: 200.0000 mg | ORAL_TABLET | Freq: Two times a day (BID) | ORAL | 3 refills | Status: DC

## 2017-09-03 NOTE — Progress Notes (Signed)
NEUROLOGY FOLLOW UP OFFICE NOTE  Kayla Hardy [id number]  DOB: [birth year]  HISTORY OF PRESENT ILLNESS: I had the pleasure of seeing Kayla Hardy in follow-up in the neurology clinic on 09/03/2017.  The patient was last seen 3 months ago for a diagnosis of seizures. Since her last visit, she reports having 2 seizures in the past 3 months. She had one at home before Thanksgiving, she was asleep and woke up to it. The last seizure occurred a couple of weeks ago at work, she felt it when it was about to happen then was told by co-workers she shook a little for 5 minutes. She was sitting, no falls. No tongue bite or incontinence. She denies missing medications, she is currently on Lamictal 150mg  BID with no side effects. She was also reporting daily headaches with pain in the frontal region radiating to the back, with throbbing pain, sometimes she has a lot of diarrhea with the headaches. She has stopped daily Tylenol and Ibuprofen, only taking it if really bad 1-2 times a week. She is taking nortriptyline 20mg  qhs which has helped a lot with her sleep and her mood. It makes her a little groggy in the morning. She has a little neck pain. She has some dizziness with the headaches.   HPI 11/26/15: This is a 27 yo RH woman with a history of anxiety and seizures. Records from [facility] and [facility] were reviewed. She started having episodes in July 2012 after returning from a cruise to the [place]. She was feeling unwell but went to work, then woke up in the hospital. After waking up, she was stuttering, numb on the right side, with cramping in the right hand and foot and severe headache. There is report of imaging and EEG done at that time. After coming home, she began having seizures that initially occurred every other day then slowed down. Per records, she had a seizure-free interval for 6 months in 2014. Since then, she reported a seizure once a week. She had seen 3 neurologists until she  went to [facility], all of which had told her seizures were stress-related. She describes the seizures as starting with getting nervous and anxious, followed by a metallic taste lasting seconds to all day. She would then start having numbness and tingling in her right arm, then becomes unconscious. Sometimes she has episodes where she is "there but trapped in my body," she knows what is going on and can hear and comprehend, but could not control it. She would feel confused and weak after. She has been told she has times where she would have shaking all over and "flopping." She reports having urinary incontinence and tongue bite a couple of times with the seizures.During her 3-day video EEG monitoring stay at [facility], wake and sleep EEG was normal. There were 5 events captured consisting of an aura of pain or abnormal sensation in the head, then a scared look and at times variable degrees of stiffening of the arms and upper trunk with tachycardia that had no epileptiform correlates. It was felt that the stereotypical nature of the events and short duration raised concern for possible seizures with a deep focus vs panic attacks vs less likely PNES. Based on the clinical and electrographic data, the patient's seizures are concerning for possible epileptic events with a deep seated focus precluding the expression of an epiletiform correlate on scalp EEG. She was discharged home on Topamax but had side effects of weight loss and paresthesias. She  has tried several AEDs with various side effects, Vimpat caused tongue itching and vomiting, making her feel "zombie-fied" and groggy. Depakote made her groggy. Trileptal made her feel dizzy and sedated. She had a rash a week after taking Keppra, but is unsure if rash was due to other cause. She was started on Zonisamide a year ago, instructed to take 3 capsules daily but this again caused her to feel "very zombie-ish." She takes 200mg  daily and reports weight loss, currently at 95 lbs.     She called our office last 01/11/16 to report 4 seizures in one day while at work, each lasting 1 minute but occurring back to back. Co-worker laid her on the couch but she apparently rolled over shaking and hit her head. There was associated urinary incontinence, no tongue bite. She was advised to increase Lamotrigine to 75mg  BID and continue Zonisamide 200mg  qhs. Since then she had only a "little" seizure in June. Zonegran was tapered off, and Lamictal increased to 100mg  BID.   Last 12/24/16, she called our office to report having several seizures in one day. Prior to this, her last seizure was in June 2017. She was not feeling good that day with nasal congestion and not sleeping well. She suddenly felt sick and dizzy, with a metal taste in her mouth.   She reports that since having the seizures, she has also been having more headaches. She would have worsening headaches 2-3 days before a seizure. She reports headaches and dizziness on a daily basis, with sharp pains in the back of her head or bilateral temporal regions. She feels nauseated and dizzy, "almost like I am drunk" with tunnel vision. Taking a nap helps. She takes Tylenol every 4 hours.   She is currently living with her boyfriend. 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 She denies any stress but was recently put back on Celexa at [facility] by her neurologist.   Diagnostic Data: I personally reviewed MRI brain with and without contrast done 12/10/15 which was normal, hippocampi symmetric with no abnormal signal or enhancement.  Epilepsy Risk Factors: She had a normal birth and early development. There is no history of febrile convulsions, CNS infections such as meningitis/encephalitis, significant traumatic brain injury, neurosurgical procedures, or family history of seizures.  Prior AEDs: Topamax, Vimpat, Depakote, Trileptal, Keppra, Zonisamide    PAST MEDICAL HISTORY: Past Medical History:   Diagnosis Date  . Anemia    as child  . Anxiety   . Depression   . Hormone disorder   . 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 Pseudoseizure   . STD (sexually transmitted disease)    gonorrhea    MEDICATIONS:  Outpatient Encounter Medications as of 09/03/2017  Medication Sig  . citalopram (CELEXA) 40 MG tablet Take 1 tablet (40 mg total) by mouth daily.  Marland Kitchen ibuprofen (ADVIL,MOTRIN) 400 MG tablet Take 1 tablet (400 mg total) by mouth every 6 (six) hours as needed.  . lamoTRIgine (LAMICTAL) 100 MG tablet Take 1.5 tablets twice a day  . Melatonin 5 MG CAPS Take by mouth.  . nortriptyline (PAMELOR) 10 MG capsule Take 1 cap at night for 2 weeks, then increase to 2 caps at night  . tiZANidine (ZANAFLEX) 2 MG tablet Take 1 tablet (2 mg total) by mouth every 8 (eight) hours as needed for muscle spasms.   No facility-administered encounter medications on file as of 09/03/2017.     ALLERGIES:  Allergies  Allergen Reactions  . Demerol Anaphylaxis    Pt breaks out in red rash on her chest, neck and face.  . Eggs Or Egg-Derived Products     Makes her sick  . Lactaid [Lactase] Diarrhea    unknown  . Shellfish Allergy Other (See Comments)    Makes her "sick"  . Keppra [Levetiracetam] Rash    FAMILY HISTORY: Family History  Problem Relation Age of Onset  . Diabetes Mellitus II Father   . Stroke Father        x4  . Hypertension Father   . Thyroid disease Father   . Diabetes Mellitus II Sister   . Thyroid disease Sister   . Thyroid disease Brother   . Autism Brother   . Thyroid disease Brother   . Autism Brother   . Colon cancer Neg Hx   . Seizures Neg Hx     SOCIAL HISTORY: Social History   Socioeconomic History  . Marital status: Single    Spouse name: Not on file  . Number of children: 0  . Years of education: some coll.  . Highest education level: Not on file  Social Needs  . Financial resource strain: Not on file  . Food  insecurity - worry: Not on file  . Food insecurity - inability: Not on file  . Transportation needs - medical: Not on file  . Transportation needs - non-medical: Not on file  Occupational History  . Occupation: [employer]  Tobacco Use  . Smoking status: Former Smoker    Packs/day: 1.00    Types: Cigarettes    Last attempt to quit: 08/19/2015    Years since quitting: 2.0  . Smokeless tobacco: Never Used  Substance and Sexual Activity  . Alcohol use: No    Alcohol/week: 0.0 oz  . Drug use: No  . Sexual activity: Yes    Partners: Male    Birth control/protection: Condom  Other Topics Concern  . Not on file  Social History Narrative   Patient drinks 1-2 cups of caffeine daily.   Patient is right handed.    REVIEW OF SYSTEMS: Constitutional: No fevers, chills, or sweats, no generalized fatigue, change in appetite Eyes: No visual changes, double vision, eye pain Ear, nose and throat: No hearing loss, ear pain, nasal congestion, sore throat Cardiovascular: No chest pain, palpitations Respiratory:  No shortness of breath at rest or with exertion, wheezes GastrointestinaI: No nausea, vomiting, diarrhea, abdominal pain, fecal incontinence Genitourinary:  No dysuria, urinary retention or frequency Musculoskeletal:  + neck pain, back pain Integumentary: No rash, pruritus, skin lesions Neurological: as above Psychiatric: + depression, insomnia, anxiety Endocrine: No palpitations, fatigue, diaphoresis, mood swings, change in appetite, change in weight, increased thirst Hematologic/Lymphatic:  No anemia, purpura, petechiae. Allergic/Immunologic: no itchy/runny eyes, nasal congestion, recent allergic reactions, rashes  PHYSICAL EXAM: Vitals:   09/03/17 1520  BP: 112/62  Pulse: (!) 106  SpO2: 98%   General: No acute distress Head:  Normocephalic/atraumatic Neck: supple, no paraspinal tenderness, full range of motion Heart:  Regular rate and rhythm Lungs:  Clear to auscultation  bilaterally Back: No paraspinal tenderness Skin/Extremities: No rash, no edema Neurological Exam: alert and oriented to person, place, and time. No aphasia or dysarthria. Fund of knowledge is appropriate.  Recent and remote memory are intact.  Attention and concentration are normal.    Able to name objects and repeat phrases. Cranial nerves: Pupils equal, round, reactive to light.  Extraocular movements intact with no nystagmus.  Visual fields full. Facial sensation intact. No facial asymmetry. Tongue, uvula, palate midline.  Motor: Bulk and tone normal, muscle strength 5/5 throughout with no pronator drift.  Sensation to light touch intact.  No extinction to double simultaneous stimulation.  Deep tendon reflexes +2 throughout, toes downgoing.  Finger to nose testing intact.  Gait narrow-based and steady, able to tandem walk adequately.  Romberg negative.  IMPRESSION: This is a 27 yo RH woman with a history of anxiety and seizures. Although some of her seizures are concerning for possible non-epileptic events (PNES), records from [facility] were reviewed and although EEG was normal throughout her stay, episodes captured were felt to be stereotypical in nature and of short duration, raising concern for possible seizures with a deep seated focus precluding the expression of an epileptiform correlate on scalp EEG focus vs panic attacks vs less likely PNES. MRI brain normal. She was discharged on antiepileptic medication, however has been intolerant of several AEDs. She had weight loss and continued seizures once a week on Zonisamide and was started on Lamotrigine. Her longest seizure-free interval has been for almost a year. She reports 2 seizures in the past 3 months, and will increase Lamotrigine to 200mg  BID. She continues to have daily headaches, she has stopped daily intake of over the counter pain medication. Increase nortriptyline to 30mg  qhs. She is aware of [place] driving laws to stop driving after a seizure,  until 6 months seizure-free. She will follow-up in 3-4 months and knows to call for any changes.   Thank you for allowing me to participate in her care.  Please do not hesitate to call for any questions or concerns.  The duration of this appointment visit was 25 minutes of face-to-face time with the patient.  Greater than 50% of this time was spent in counseling, explanation of diagnosis, planning of further management, and coordination of care.   Patrcia Dolly, M.D.   CC: Lenise Herald, PA-C

## 2017-09-03 NOTE — Patient Instructions (Signed)
1. Increase Lamictal to 200mg  twice a day. With your current prescription of Lamictal 100mg , take 2 tablets twice a day. Once your bottle is done, your new prescription will be for Lamictal 200mg , take 1 tablet twice a day 2. Increase nortriptyline 10mg : take 3 capsules in the evening 3. Great job with minimizing Tylenol/Ibuprofen intake to 2-3 times a week, otherwise headaches may worsen more 4. Follow-up in 3-4 months, call for any changes  Seizure Precautions: 1. If medication has been prescribed for you to prevent seizures, take it exactly as directed.  Do not stop taking the medicine without talking to your doctor first, even if you have not had a seizure in a long time.   2. Avoid activities in which a seizure would cause danger to yourself or to others.  Don't operate dangerous machinery, swim alone, or climb in high or dangerous places, such as on ladders, roofs, or girders.  Do not drive unless your doctor says you may.  3. If you have any warning that you may have a seizure, lay down in a safe place where you can't hurt yourself.    4.  No driving for 6 months from last seizure, [place].   Please refer to the following link on the Epilepsy Foundation of America's website for more information: http://www.epilepsyfoundation.org/answerplace/Social/driving/drivingu.cfm   5.  Maintain good sleep hygiene. Avoid alcohol  6.  Notify your neurology if you are planning pregnancy or if you become pregnant.  7.  Contact your doctor if you have any problems that may be related to the medicine you are taking.  8.  Call 911 and bring the patient back to the ED if:        A.  The seizure lasts longer than 5 minutes.       B.  The patient doesn't awaken shortly after the seizure  C.  The patient has new problems such as difficulty seeing, speaking or moving  D.  The patient was injured during the seizure  E.  The patient has a temperature over 102 F (39C)  F.  The patient  vomited and now is having trouble breathing

## 2017-12-12 ENCOUNTER — Other Ambulatory Visit: Payer: Self-pay

## 2017-12-12 ENCOUNTER — Encounter: Payer: Self-pay | Admitting: Neurology

## 2017-12-12 ENCOUNTER — Ambulatory Visit: Payer: Medicaid - Out of State | Admitting: Neurology

## 2017-12-12 VITALS — BP 100/66 | HR 89 | Ht 64.0 in | Wt 149.0 lb

## 2017-12-12 DIAGNOSIS — R51 Headache: Secondary | ICD-10-CM

## 2017-12-12 DIAGNOSIS — G40009 Localization-related (focal) (partial) idiopathic epilepsy and epileptic syndromes with seizures of localized onset, not intractable, without status epilepticus: Secondary | ICD-10-CM

## 2017-12-12 DIAGNOSIS — R519 Headache, unspecified: Secondary | ICD-10-CM

## 2017-12-12 MED ORDER — LAMOTRIGINE 200 MG PO TABS: 200.0000 mg | ORAL_TABLET | Freq: Two times a day (BID) | ORAL | 3 refills | Status: DC

## 2017-12-12 NOTE — Patient Instructions (Signed)
1. Continue Lamotrigine 200mg  twice a day 2. See how your headaches are with the allergy season, if headaches worsen or continue, restart nortriptyline 30mg  at bedtime. Call our office if you need refills 3. Follow-up in 6 months, call for any changes  Seizure Precautions: 1. If medication has been prescribed for you to prevent seizures, take it exactly as directed.  Do not stop taking the medicine without talking to your doctor first, even if you have not had a seizure in a long time.   2. Avoid activities in which a seizure would cause danger to yourself or to others.  Don't operate dangerous machinery, swim alone, or climb in high or dangerous places, such as on ladders, roofs, or girders.  Do not drive unless your doctor says you may.  3. If you have any warning that you may have a seizure, lay down in a safe place where you can't hurt yourself.    4.  No driving for 6 months from last seizure, [place].   Please refer to the following link on the Epilepsy Foundation of America's website for more information: http://www.epilepsyfoundation.org/answerplace/Social/driving/drivingu.cfm   5.  Maintain good sleep hygiene. Avoid alcohol.  6.  Notify your neurology if you are planning pregnancy or if you become pregnant.  7.  Contact your doctor if you have any problems that may be related to the medicine you are taking.  8.  Call 911 and bring the patient back to the ED if:        A.  The seizure lasts longer than 5 minutes.       B.  The patient doesn't awaken shortly after the seizure  C.  The patient has new problems such as difficulty seeing, speaking or moving  D.  The patient was injured during the seizure  E.  The patient has a temperature over 102 F (39C)  F.  The patient vomited and now is having trouble breathing

## 2017-12-12 NOTE — Progress Notes (Signed)
NEUROLOGY FOLLOW UP OFFICE NOTE  Danyle Boening Kosch [id number]  DOB: 22-Jan-1991  HISTORY OF PRESENT ILLNESS: I had the pleasure of seeing Carley Decicco in follow-up in the neurology clinic on 12/12/2017.  The patient was last seen 3 months ago for a diagnosis of seizures. On her last visit, she reported having 2 seizures around the end of 2018. Lamotrigine dose increased to 200mg  BID. She was also reporting daily headaches, nortriptyline dose increased to 30mg  qhs. She denies any further seizures with increased Lamotrigine dose, no side effects. She had noticed an improvement in her headaches with the higher dose of nortriptyline, but got pregnant and stopped it. She unfortunately had a miscarriage after 4 weeks but has not restarted the medication. She was doing better except recently headaches again increased due to her allergies. She was started on Seroquel 2 months ago for mood, it also helps with sleep. Mood is better but not exactly where she wants it to be. She has gained weight and feels good overall. She denies any dizziness, vision changes, focal numbness/tingling/weakness, no falls.   HPI 11/26/15: This is a 27 yo RH woman with a history of anxiety and seizures. Records from [facility] and [facility] were reviewed. She started having episodes in July 2012 after returning from a cruise to the [place]. She was feeling unwell but went to work, then woke up in the hospital. After waking up, she was stuttering, numb on the right side, with cramping in the right hand and foot and severe headache. There is report of imaging and EEG done at that time. After coming home, she began having seizures that initially occurred every other day then slowed down. Per records, she had a seizure-free interval for 6 months in 2014. Since then, she reported a seizure once a week. She had seen 3 neurologists until she went to [facility], all of which had told her seizures were stress-related. She describes the  seizures as starting with getting nervous and anxious, followed by a metallic taste lasting seconds to all day. She would then start having numbness and tingling in her right arm, then becomes unconscious. Sometimes she has episodes where she is "there but trapped in my body," she knows what is going on and can hear and comprehend, but could not control it. She would feel confused and weak after. She has been told she has times where she would have shaking all over and "flopping." She reports having urinary incontinence and tongue bite a couple of times with the seizures.During her 3-day video EEG monitoring stay at [facility], wake and sleep EEG was normal. There were 5 events captured consisting of an aura of pain or abnormal sensation in the head, then a scared look and at times variable degrees of stiffening of the arms and upper trunk with tachycardia that had no epileptiform correlates. It was felt that the stereotypical nature of the events and short duration raised concern for possible seizures with a deep focus vs panic attacks vs less likely PNES. Based on the clinical and electrographic data, the patient's seizures are concerning for possible epileptic events with a deep seated focus precluding the expression of an epiletiform correlate on scalp EEG. She was discharged home on Topamax but had side effects of weight loss and paresthesias. She has tried several AEDs with various side effects, Vimpat caused tongue itching and vomiting, making her feel "zombie-fied" and groggy. Depakote made her groggy. Trileptal made her feel dizzy and sedated. She had a rash  a week after taking Keppra, but is unsure if rash was due to other cause. She was started on Zonisamide a year ago, instructed to take 3 capsules daily but this again caused her to feel "very zombie-ish." She takes 200mg  daily and reports weight loss, currently at 95 lbs.   She called our office last 01/11/16 to report 4 seizures in one day while at work,  each lasting 1 minute but occurring back to back. Co-worker laid her on the couch but she apparently rolled over shaking and hit her head. There was associated urinary incontinence, no tongue bite. She was advised to increase Lamotrigine to 75mg  BID and continue Zonisamide 200mg  qhs. Since then she had only a "little" seizure in June. Zonegran was tapered off, and Lamictal increased to 100mg  BID.   Last 12/24/16, she called our office to report having several seizures in one day. Prior to this, her last seizure was in June 2017. She was not feeling good that day with nasal congestion and not sleeping well. She suddenly felt sick and dizzy, with a metal taste in her mouth.   She reports that since having the seizures, she has also been having more headaches. She would have worsening headaches 2-3 days before a seizure. She reports headaches and dizziness on a daily basis, with sharp pains in the back of her head or bilateral temporal regions. She feels nauseated and dizzy, "almost like I am drunk" with tunnel vision. Taking a nap helps. She takes Tylenol every 4 hours.   She is currently living with her boyfriend. 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 She denies any stress but was recently put back on Celexa at [facility] by her neurologist.   Diagnostic Data: I personally reviewed MRI brain with and without contrast done 12/10/15 which was normal, hippocampi symmetric with no abnormal signal or enhancement.  Epilepsy Risk Factors: She had a normal birth and early development. There is no history of febrile convulsions, CNS infections such as meningitis/encephalitis, significant traumatic brain injury, neurosurgical procedures, or family history of seizures.  Prior AEDs: Topamax, Vimpat, Depakote, Trileptal, Keppra, Zonisamide    PAST MEDICAL HISTORY: Past Medical History:  Diagnosis Date  . Anemia    as child  . Anxiety   . Depression   . Hormone  disorder   . 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 Pseudoseizure   . STD (sexually transmitted disease)    gonorrhea    MEDICATIONS:  Outpatient Encounter Medications as of 12/12/2017  Medication Sig  . citalopram (CELEXA) 40 MG tablet Take 1 tablet (40 mg total) by mouth daily.  Marland Kitchen ibuprofen (ADVIL,MOTRIN) 400 MG tablet Take 1 tablet (400 mg total) by mouth every 6 (six) hours as needed.  . lamoTRIgine (LAMICTAL) 200 MG tablet Take 1 tablet (200 mg total) by mouth 2 (two) times daily.  . Melatonin 5 MG CAPS Take by mouth.  . nortriptyline (PAMELOR) 10 MG capsule Take 3 caps at night  . tiZANidine (ZANAFLEX) 2 MG tablet Take 1 tablet (2 mg total) by mouth every 8 (eight) hours as needed for muscle spasms.   No facility-administered encounter medications on file as of 12/12/2017.     ALLERGIES: Allergies  Allergen Reactions  . Demerol Anaphylaxis    Pt breaks out in red rash on her chest, neck and face.  . Eggs Or Egg-Derived Products     Makes her sick  . Lactaid [Lactase]  Diarrhea    unknown  . Shellfish Allergy Other (See Comments)    Makes her "sick"  . Keppra [Levetiracetam] Rash    FAMILY HISTORY: Family History  Problem Relation Age of Onset  . Diabetes Mellitus II Father   . Stroke Father        x4  . Hypertension Father   . Thyroid disease Father   . Diabetes Mellitus II Sister   . Thyroid disease Sister   . Thyroid disease Brother   . Autism Brother   . Thyroid disease Brother   . Autism Brother   . Colon cancer Neg Hx   . Seizures Neg Hx     SOCIAL HISTORY: Social History   Socioeconomic History  . Marital status: Single    Spouse name: Not on file  . Number of children: 0  . Years of education: some coll.  . Highest education level: Not on file  Occupational History  . Occupation: [employer]  Social Needs  . Financial resource strain: Not on file  . Food insecurity:    Worry: Not on file     Inability: Not on file  . Transportation needs:    Medical: Not on file    Non-medical: Not on file  Tobacco Use  . Smoking status: Former Smoker    Packs/day: 1.00    Types: Cigarettes    Last attempt to quit: 08/19/2015    Years since quitting: 2.3  . Smokeless tobacco: Never Used  Substance and Sexual Activity  . Alcohol use: No    Alcohol/week: 0.0 oz  . Drug use: No  . Sexual activity: Yes    Partners: Male    Birth control/protection: Condom  Lifestyle  . Physical activity:    Days per week: Not on file    Minutes per session: Not on file  . Stress: Not on file  Relationships  . Social connections:    Talks on phone: Not on file    Gets together: Not on file    Attends religious service: Not on file    Active member of club or organization: Not on file    Attends meetings of clubs or organizations: Not on file    Relationship status: Not on file  . Intimate partner violence:    Fear of current or ex partner: Not on file    Emotionally abused: Not on file    Physically abused: Not on file    Forced sexual activity: Not on file  Other Topics Concern  . Not on file  Social History Narrative   Patient drinks 1-2 cups of caffeine daily.   Patient is right handed.    REVIEW OF SYSTEMS: Constitutional: No fevers, chills, or sweats, no generalized fatigue, change in appetite Eyes: No visual changes, double vision, eye pain Ear, nose and throat: No hearing loss, ear pain, nasal congestion, sore throat Cardiovascular: No chest pain, palpitations Respiratory:  No shortness of breath at rest or with exertion, wheezes GastrointestinaI: No nausea, vomiting, diarrhea, abdominal pain, fecal incontinence Genitourinary:  No dysuria, urinary retention or frequency Musculoskeletal:  + neck pain, back pain Integumentary: No rash, pruritus, skin lesions Neurological: as above Psychiatric: + depression, insomnia, anxiety Endocrine: No palpitations, fatigue, diaphoresis, mood  swings, change in appetite, change in weight, increased thirst Hematologic/Lymphatic:  No anemia, purpura, petechiae. Allergic/Immunologic: no itchy/runny eyes, nasal congestion, recent allergic reactions, rashes  PHYSICAL EXAM: Vitals:   12/12/17 1535  BP: 100/66  Pulse: 89  SpO2: 99%  General: No acute distress Head:  Normocephalic/atraumatic Neck: supple, no paraspinal tenderness, full range of motion Heart:  Regular rate and rhythm Lungs:  Clear to auscultation bilaterally Back: No paraspinal tenderness Skin/Extremities: No rash, no edema Neurological Exam: alert and oriented to person, place, and time. No aphasia or dysarthria. Fund of knowledge is appropriate.  Recent and remote memory are intact.  Attention and concentration are normal.    Able to name objects and repeat phrases. Cranial nerves: Pupils equal, round, reactive to light.  Extraocular movements intact with no nystagmus. Visual fields full. Facial sensation intact. No facial asymmetry. Tongue, uvula, palate midline.  Motor: Bulk and tone normal, muscle strength 5/5 throughout with no pronator drift.  Sensation to light touch intact.  No extinction to double simultaneous stimulation.  Deep tendon reflexes +2 throughout, toes downgoing.  Finger to nose testing intact.  Gait narrow-based and steady, able to tandem walk adequately.  Romberg negative.  IMPRESSION: This is a 27 yo RH woman with a history of anxiety and seizures. Although some of her seizures are concerning for possible non-epileptic events (PNES), records from [facility] were reviewed and although EEG was normal throughout her stay, episodes captured were felt to be stereotypical in nature and of short duration, raising concern for possible seizures with a deep seated focus precluding the expression of an epileptiform correlate on scalp EEG focus vs panic attacks vs less likely PNES. MRI brain normal. She was discharged on antiepileptic medication, however has been  intolerant of several AEDs. She had weight loss and continued seizures once a week on Zonisamide and was started on Lamotrigine. Her longest seizure-free interval has been for almost a year. She denies any seizures since January 2019, now on Lamotrigine 200mg  BID without side effects. She had stopped the nortriptyline and will monitor headaches for now, if headaches increase, she will restart 30mg  qhs dose. She is aware of [place] driving laws to stop driving after a seizure, until 6 months seizure-free. She will follow-up in 6 months and knows to call for any changes.   Thank you for allowing me to participate in her care.  Please do not hesitate to call for any questions or concerns.  The duration of this appointment visit was 15 minutes of face-to-face time with the patient.  Greater than 50% of this time was spent in counseling, explanation of diagnosis, planning of further management, and coordination of care.   Patrcia DollyKaren Aquino, M.D.   CC: Lenise HeraldBenjamin Mann, PA-C

## 2018-01-31 ENCOUNTER — Telehealth: Payer: Self-pay | Admitting: Neurology

## 2018-01-31 NOTE — Telephone Encounter (Signed)
Spoke with pt.  She states that she has been experiencing headaches/migraines x 2 weeks now.  States that they come and go, lasting a couple of hours each time.  Pt becomes nauseas, no vomiting.  States that she has tried several OTC medications - Tylenol, Advil, Excedrin Migraine - and none have worked.  Let pt know that Dr. Karel JarvisAquino out of the office but that I would send to her and return call with recommendations.  Please advise.

## 2018-01-31 NOTE — Telephone Encounter (Signed)
Patient Kayla Hardy needing to have something called in for her due to her headaches she has had for about 2 weeks. She has had Nausea and Blurred Vision. Please Call. Thanks

## 2018-02-01 ENCOUNTER — Other Ambulatory Visit: Payer: Self-pay

## 2018-02-01 MED ORDER — PREDNISONE 20 MG PO TABS: ORAL_TABLET | ORAL | 0 refills | Status: DC

## 2018-02-01 NOTE — Telephone Encounter (Signed)
Let's do a prednisone taper to break her headaches. If she is agreeable, pls send in Rx for Prednisone 20mg : Take 3 tabs on day 1, 2-1/2 tabs on day 2, 2 tabs on day 3, 1-1/2 tabs on day 4, 1 tab on day 5, 1/2 tab on days 6 and 7, then stop. #11 tabs no refills Thanks

## 2018-02-04 NOTE — Telephone Encounter (Signed)
Spoke with pt on 02/01/18 relaying message below.  Rx for prednisone sent to pharmacy

## 2018-02-07 NOTE — Telephone Encounter (Signed)
Pt states the Prednisone has not been helping and is still having headaches. Best call back # [phone].

## 2018-02-08 ENCOUNTER — Telehealth: Payer: Self-pay | Admitting: Neurology

## 2018-02-08 DIAGNOSIS — R519 Headache, unspecified: Secondary | ICD-10-CM

## 2018-02-08 DIAGNOSIS — R51 Headache: Principal | ICD-10-CM

## 2018-02-08 MED ORDER — NORTRIPTYLINE HCL 10 MG PO CAPS: ORAL_CAPSULE | ORAL | 5 refills | Status: DC

## 2018-02-08 NOTE — Telephone Encounter (Signed)
The last time she was here she told me she stopped nortriptyline, and we discussed that if headaches come back, to restart 30mg  qhs. Has she restarted it? If not, resume nortriptyline. Thanks

## 2018-02-08 NOTE — Telephone Encounter (Signed)
If patient is at the pharmacy, call can be transferred to refill team.  1.     Which medications need to be refilled? (please list name of each medication and dose if know) Nortriptyline  2.     Which pharmacy/location (including street and city if local pharmacy) is medication to be sent to? [facility] ([place])  3.     Do they need a 30 or 90 day supply? Whichever one covered by  [employer]

## 2018-02-08 NOTE — Telephone Encounter (Signed)
Called pt.  No answer.  LMOM relaying message below.  Asked for return call.

## 2018-02-08 NOTE — Telephone Encounter (Signed)
Rx sent to pharmacy listed below. 

## 2018-02-19 ENCOUNTER — Telehealth: Payer: Self-pay | Admitting: Neurology

## 2018-02-19 NOTE — Telephone Encounter (Signed)
Patient is still having headache and she wants to talk to someone per her VM message

## 2018-03-08 ENCOUNTER — Telehealth: Payer: Self-pay | Admitting: Neurology

## 2018-03-08 NOTE — Telephone Encounter (Signed)
Pls remind her that medications are not called a failure until she in on a good dose for 2 months at least. She is still on a low dose, increase to 50mg  daily. Pls send in Rx for nortriptyline 50mg  qhs. Also, pls remind her that headaches will not go away if she is taking more than 2-3 Tylenol or Excedrin a week. Thanks

## 2018-03-08 NOTE — Telephone Encounter (Signed)
Patient called to say that she is still having headaches.Please Call. Thanks

## 2018-03-08 NOTE — Telephone Encounter (Signed)
Spoke with pt who states that she is still experiencing headaches very frequently.  States that Nortriptyline is not helping.  She is alternating Tylenol and Excedrin Migraine with no relief.  Please advise.

## 2018-03-12 ENCOUNTER — Other Ambulatory Visit: Payer: Self-pay | Admitting: *Deleted

## 2018-03-12 DIAGNOSIS — R51 Headache: Principal | ICD-10-CM

## 2018-03-12 DIAGNOSIS — R519 Headache, unspecified: Secondary | ICD-10-CM

## 2018-03-12 MED ORDER — NORTRIPTYLINE HCL 50 MG PO CAPS: ORAL_CAPSULE | ORAL | 5 refills | Status: DC

## 2018-03-12 NOTE — Telephone Encounter (Signed)
Patient notified and Rx sent in. 

## 2018-05-13 ENCOUNTER — Other Ambulatory Visit: Payer: Self-pay

## 2018-05-13 DIAGNOSIS — F329 Major depressive disorder, single episode, unspecified: Secondary | ICD-10-CM

## 2018-05-13 DIAGNOSIS — F32A Depression, unspecified: Secondary | ICD-10-CM

## 2018-05-13 MED ORDER — CITALOPRAM HYDROBROMIDE 40 MG PO TABS
40.0000 mg | ORAL_TABLET | Freq: Every day | ORAL | 11 refills | Status: AC
Start: 2018-05-13 — End: ?

## 2018-06-24 ENCOUNTER — Encounter: Payer: Self-pay | Admitting: Neurology

## 2018-06-24 ENCOUNTER — Ambulatory Visit (INDEPENDENT_AMBULATORY_CARE_PROVIDER_SITE_OTHER): Payer: Medicaid - Out of State | Admitting: Neurology

## 2018-06-24 ENCOUNTER — Other Ambulatory Visit: Payer: Self-pay

## 2018-06-24 VITALS — BP 114/70 | HR 99 | Ht 64.0 in | Wt 166.0 lb

## 2018-06-24 DIAGNOSIS — R519 Headache, unspecified: Secondary | ICD-10-CM

## 2018-06-24 DIAGNOSIS — R51 Headache: Secondary | ICD-10-CM | POA: Diagnosis not present

## 2018-06-24 DIAGNOSIS — G40009 Localization-related (focal) (partial) idiopathic epilepsy and epileptic syndromes with seizures of localized onset, not intractable, without status epilepticus: Secondary | ICD-10-CM

## 2018-06-24 MED ORDER — NORTRIPTYLINE HCL 25 MG PO CAPS: ORAL_CAPSULE | ORAL | 0 refills | Status: DC

## 2018-06-24 NOTE — Progress Notes (Signed)
NEUROLOGY FOLLOW UP OFFICE NOTE  Kayla Hardy [id number]  DOB: [birth year]  HISTORY OF PRESENT ILLNESS: I had the pleasure of seeing Kayla Hardy in follow-up in the neurology clinic on 06/24/2018.  The patient was last seen 6 months ago for a diagnosis of seizures and daily headaches. She is now [redacted] weeks pregnant. She denies any seizures since January 2019 on Lamotrigine 200mg  BID. Lamictal level checked at [facility] office on 06/19/18 was 5.4. She denies any staring/unresponsive episodes, gaps in time, olfactory/gustatory hallucinations, focal numbness/tingling/weakness, no falls. She continues to have headaches on nortriptyline 50mg  qhs, stating they are not as frequent but pretty severe when they occur. She had 2 last week. She was previously reporting daily headaches. She feels really good with current pregnancy, due date in June 2020.   History on Initial Assessment 11/26/15: This is a 27 yo RH woman with a history of anxiety and seizures. Records from [facility] and [facility] were reviewed. She started having episodes in July 2012 after returning from a cruise to the [place]. She was feeling unwell but went to work, then woke up in the hospital. After waking up, she was stuttering, numb on the right side, with cramping in the right hand and foot and severe headache. There is report of imaging and EEG done at that time. After coming home, she began having seizures that initially occurred every other day then slowed down. Per records, she had a seizure-free interval for 6 months in 2014. Since then, she reported a seizure once a week. She had seen 3 neurologists until she went to [facility], all of which had told her seizures were stress-related. She describes the seizures as starting with getting nervous and anxious, followed by a metallic taste lasting seconds to all day. She would then start having numbness and tingling in her right arm, then becomes unconscious. Sometimes she has episodes where  she is "there but trapped in my body," she knows what is going on and can hear and comprehend, but could not control it. She would feel confused and weak after. She has been told she has times where she would have shaking all over and "flopping." She reports having urinary incontinence and tongue bite a couple of times with the seizures.During her 3-day video EEG monitoring stay at [facility], wake and sleep EEG was normal. There were 5 events captured consisting of an aura of pain or abnormal sensation in the head, then a scared look and at times variable degrees of stiffening of the arms and upper trunk with tachycardia that had no epileptiform correlates. It was felt that the stereotypical nature of the events and short duration raised concern for possible seizures with a deep focus vs panic attacks vs less likely PNES. Based on the clinical and electrographic data, the patient's seizures are concerning for possible epileptic events with a deep seated focus precluding the expression of an epiletiform correlate on scalp EEG. She was discharged home on Topamax but had side effects of weight loss and paresthesias. She has tried several AEDs with various side effects, Vimpat caused tongue itching and vomiting, making her feel "zombie-fied" and groggy. Depakote made her groggy. Trileptal made her feel dizzy and sedated. She had a rash a week after taking Keppra, but is unsure if rash was due to other cause. She was started on Zonisamide a year ago, instructed to take 3 capsules daily but this again caused her to feel "very zombie-ish." She takes 200mg  daily and reports weight  loss, currently at 95 lbs.   She called our office last 01/11/16 to report 4 seizures in one day while at work, each lasting 1 minute but occurring back to back. Co-worker laid her on the couch but she apparently rolled over shaking and hit her head. There was associated urinary incontinence, no tongue bite. She was advised to increase Lamotrigine to  75mg  BID and continue Zonisamide 200mg  qhs. Since then she had only a "little" seizure in June. Zonegran was tapered off, and Lamictal increased to 100mg  BID.   Last 12/24/16, she called our office to report having several seizures in one day. Prior to this, her last seizure was in June 2017. She was not feeling good that day with nasal congestion and not sleeping well. She suddenly felt sick and dizzy, with a metal taste in her mouth.   She reports that since having the seizures, she has also been having more headaches. She would have worsening headaches 2-3 days before a seizure. She reports headaches and dizziness on a daily basis, with sharp pains in the back of her head or bilateral temporal regions. She feels nauseated and dizzy, "almost like I am drunk" with tunnel vision. Taking a nap helps. She takes Tylenol every 4 hours.   She is currently living with her boyfriend. 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 She denies any stress but was recently put back on Celexa at [facility] by her neurologist.   Diagnostic Data: I personally reviewed MRI brain with and without contrast done 12/10/15 which was normal, hippocampi symmetric with no abnormal signal or enhancement.  Epilepsy Risk Factors: She had a normal birth and early development. There is no history of febrile convulsions, CNS infections such as meningitis/encephalitis, significant traumatic brain injury, neurosurgical procedures, or family history of seizures.  Prior AEDs: Topamax, Vimpat, Depakote, Trileptal, Keppra, Zonisamide    PAST MEDICAL HISTORY: Past Medical History:  Diagnosis Date  . Anemia    as child  . Anxiety   . Depression   . Hormone disorder   . 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 Pseudoseizure   . STD (sexually transmitted disease)    gonorrhea    MEDICATIONS:  Outpatient Encounter Medications as of 06/24/2018    Medication Sig  . citalopram (CELEXA) 40 MG tablet Take 1 tablet (40 mg total) by mouth daily.  Marland Kitchen ibuprofen (ADVIL,MOTRIN) 400 MG tablet Take 1 tablet (400 mg total) by mouth every 6 (six) hours as needed.  . lamoTRIgine (LAMICTAL) 200 MG tablet Take 1 tablet (200 mg total) by mouth 2 (two) times daily.  Marland Kitchen levothyroxine (SYNTHROID, LEVOTHROID) 100 MCG tablet Take 100 mcg by mouth daily before breakfast.  . Melatonin 5 MG CAPS Take by mouth.  . nortriptyline (PAMELOR) 50 MG capsule 50 mg at bedtime.  . predniSONE (DELTASONE) 20 MG tablet Take 3 tabs on day 1, 2-1/2 tabs on day 2, 2 tabs on day 3, 1-1/2 tabs on day 4, 1 tab on day 5, 1/2 tab on days 6 and 7, then stop  . QUEtiapine (SEROQUEL) 25 MG tablet   . tiZANidine (ZANAFLEX) 2 MG tablet Take 1 tablet (2 mg total) by mouth every 8 (eight) hours as needed for muscle spasms.   No facility-administered encounter medications on file as of 06/24/2018.     ALLERGIES: Allergies  Allergen Reactions  . Demerol Anaphylaxis    Pt breaks out in red rash on her  chest, neck and face.  . Eggs Or Egg-Derived Products     Makes her sick  . Lactaid [Lactase] Diarrhea    unknown  . Shellfish Allergy Other (See Comments)    Makes her "sick"  . Keppra [Levetiracetam] Rash    FAMILY HISTORY: Family History  Problem Relation Age of Onset  . Diabetes Mellitus II Father   . Stroke Father        x4  . Hypertension Father   . Thyroid disease Father   . Diabetes Mellitus II Sister   . Thyroid disease Sister   . Thyroid disease Brother   . Autism Brother   . Thyroid disease Brother   . Autism Brother   . Colon cancer Neg Hx   . Seizures Neg Hx     SOCIAL HISTORY: Social History   Socioeconomic History  . Marital status: Single    Spouse name: Not on file  . Number of children: 0  . Years of education: some coll.  . Highest education level: Not on file  Occupational History  . Occupation: [employer]  Social Needs  . Financial  resource strain: Not on file  . Food insecurity:    Worry: Not on file    Inability: Not on file  . Transportation needs:    Medical: Not on file    Non-medical: Not on file  Tobacco Use  . Smoking status: Former Smoker    Packs/day: 1.00    Types: Cigarettes    Last attempt to quit: 08/19/2015    Years since quitting: 2.8  . Smokeless tobacco: Never Used  Substance and Sexual Activity  . Alcohol use: No    Alcohol/week: 0.0 standard drinks  . Drug use: No  . Sexual activity: Yes    Partners: Male    Birth control/protection: Condom  Lifestyle  . Physical activity:    Days per week: Not on file    Minutes per session: Not on file  . Stress: Not on file  Relationships  . Social connections:    Talks on phone: Not on file    Gets together: Not on file    Attends religious service: Not on file    Active member of club or organization: Not on file    Attends meetings of clubs or organizations: Not on file    Relationship status: Not on file  . Intimate partner violence:    Fear of current or ex partner: Not on file    Emotionally abused: Not on file    Physically abused: Not on file    Forced sexual activity: Not on file  Other Topics Concern  . Not on file  Social History Narrative   Patient drinks 1-2 cups of caffeine daily.   Patient is right handed.    REVIEW OF SYSTEMS: Constitutional: No fevers, chills, or sweats, no generalized fatigue, change in appetite Eyes: No visual changes, double vision, eye pain Ear, nose and throat: No hearing loss, ear pain, nasal congestion, sore throat Cardiovascular: No chest pain, palpitations Respiratory:  No shortness of breath at rest or with exertion, wheezes GastrointestinaI: No nausea, vomiting, diarrhea, abdominal pain, fecal incontinence Genitourinary:  No dysuria, urinary retention or frequency Musculoskeletal:  + neck pain, back pain Integumentary: No rash, pruritus, skin lesions Neurological: as above Psychiatric:  No depression, insomnia, anxiety Endocrine: No palpitations, fatigue, diaphoresis, mood swings, change in appetite, change in weight, increased thirst Hematologic/Lymphatic:  No anemia, purpura, petechiae. Allergic/Immunologic: no itchy/runny eyes, nasal congestion,  recent allergic reactions, rashes  PHYSICAL EXAM: Vitals:   06/24/18 1510  BP: 114/70  Pulse: 99  SpO2: 100%   General: No acute distress Head:  Normocephalic/atraumatic Neck: supple, no paraspinal tenderness, full range of motion Heart:  Regular rate and rhythm Lungs:  Clear to auscultation bilaterally Back: No paraspinal tenderness Skin/Extremities: No rash, no edema Neurological Exam: alert and oriented to person, place, and time. No aphasia or dysarthria. Fund of knowledge is appropriate.  Recent and remote memory are intact.  Attention and concentration are normal.    Able to name objects and repeat phrases. Cranial nerves: Pupils equal, round, reactive to light.  Extraocular movements intact with no nystagmus. Visual fields full. Facial sensation intact. No facial asymmetry. Tongue, uvula, palate midline.  Motor: Bulk and tone normal, muscle strength 5/5 throughout with no pronator drift.  Sensation to light touch intact.  No extinction to double simultaneous stimulation. Finger to nose testing intact.  Gait narrow-based and steady, able to tandem walk adequately.  Romberg negative.  IMPRESSION: This is a 27 yo RH woman with a history of anxiety and seizures. Although some of her seizures are concerning for possible non-epileptic events (PNES), records from [facility] were reviewed and although EEG was normal throughout her stay, episodes captured were felt to be stereotypical in nature and of short duration, raising concern for possible seizures with a deep seated focus precluding the expression of an epileptiform correlate on scalp EEG focus vs panic attacks vs less likely PNES. MRI brain normal. She was discharged on  antiepileptic medication, however has been intolerant of several AEDs. She is doing well on Lamotrigine 200mg  BID, last seizure January 2019. She is now [redacted] weeks pregnant, we discussed checking Lamotrigine level every month, and usually needing to increase dose during pregnancy. She would like to wean off nortriptyline for headaches, reduce to 25mg  qhs x 1 week, then 25mg  every other night for 1 week, then stop. She knows to minimize Tylenol intake to 2-3 a week to avoid rebound headaches. She is aware of [place] driving laws to stop driving after a seizure, until 6 months seizure-free. She will follow-up in 3-4 months and knows to call for any changes.   Thank you for allowing me to participate in her care.  Please do not hesitate to call for any questions or concerns.  The duration of this appointment visit was 27 minutes of face-to-face time with the patient.  Greater than 50% of this time was spent in counseling, explanation of diagnosis, planning of further management, and coordination of care.   Patrcia Dolly, M.D.   CC: Dr. Zonia Kief

## 2018-06-24 NOTE — Patient Instructions (Signed)
1. Have Lamictal level checked every month during pregnancy 2. Continue Lamictal 200mg  twice a day for now, we usually increase dose during pregnancy 3. Minimize Tylenol intake to 2-3 a week for headaches, otherwise headaches can worsen if taken on a daily basis 4. Follow-up in 3-4 months, call for any changes  Seizure Precautions: 1. If medication has been prescribed for you to prevent seizures, take it exactly as directed.  Do not stop taking the medicine without talking to your doctor first, even if you have not had a seizure in a long time.   2. Avoid activities in which a seizure would cause danger to yourself or to others.  Don't operate dangerous machinery, swim alone, or climb in high or dangerous places, such as on ladders, roofs, or girders.  Do not drive unless your doctor says you may.  3. If you have any warning that you may have a seizure, lay down in a safe place where you can't hurt yourself.    4.  No driving for 6 months from last seizure, as per [facility].   Please refer to the following link on the [employer]ile[employer] of [employer]'s website for more information: http://www.epilepsyfoundation.org/answerplace/Social/driving/drivingu.cfm   5.  Maintain good sleep hygiene. Avoid alcohol.  6.  Contact your doctor if you have any problems that may be related to the medicine you are taking.  7.  Call 911 and bring the patient back to the ED if:        A.  The seizure lasts longer than 5 minutes.       B.  The patient doesn't awaken shortly after the seizure  C.  The patient has new problems such as difficulty seeing, speaking or moving  D.  The patient was injured during the seizure  E.  The patient has a temperature over 102 F (39C)  F.  The patient vomited and now is having trouble breathing

## 2018-06-25 ENCOUNTER — Encounter: Payer: Self-pay | Admitting: Neurology

## 2018-09-13 ENCOUNTER — Emergency Department (HOSPITAL_COMMUNITY)
Admission: EM | Admit: 2018-09-13 | Discharge: 2018-09-14 | Disposition: A | Discharge status: Home / Self Care | Payer: Medicaid - Out of State | Attending: Emergency Medicine | Admitting: Emergency Medicine

## 2018-09-13 ENCOUNTER — Encounter (HOSPITAL_COMMUNITY): Payer: Self-pay | Admitting: Emergency Medicine

## 2018-09-13 ENCOUNTER — Other Ambulatory Visit: Payer: Self-pay

## 2018-09-13 DIAGNOSIS — Z3A20 20 weeks gestation of pregnancy: Secondary | ICD-10-CM

## 2018-09-13 DIAGNOSIS — Z79899 Other long term (current) drug therapy: Secondary | ICD-10-CM | POA: Diagnosis not present

## 2018-09-13 DIAGNOSIS — R102 Pelvic and perineal pain: Secondary | ICD-10-CM

## 2018-09-13 DIAGNOSIS — O26899 Other specified pregnancy related conditions, unspecified trimester: Secondary | ICD-10-CM

## 2018-09-13 DIAGNOSIS — Z87891 Personal history of nicotine dependence: Secondary | ICD-10-CM | POA: Insufficient documentation

## 2018-09-13 DIAGNOSIS — O26892 Other specified pregnancy related conditions, second trimester: Secondary | ICD-10-CM | POA: Diagnosis not present

## 2018-09-13 NOTE — ED Triage Notes (Addendum)
Pt states she is [redacted] weeks pregnant.  Reports she has felt like she had UTI for the last month and OB states that urine is clear.  Reports increase in dysuria today.  Reports pelvic pain and foul smelling urine.  Last OB appt on Tuesday.  Denies fever and chills.

## 2018-09-14 ENCOUNTER — Emergency Department (HOSPITAL_COMMUNITY): Payer: Medicaid - Out of State

## 2018-09-14 LAB — COMPREHENSIVE METABOLIC PANEL
ALT: 11 U/L (ref 0–44)
AST: 14 U/L — ABNORMAL LOW (ref 15–41)
Albumin: 3.4 g/dL — ABNORMAL LOW (ref 3.5–5.0)
Alkaline Phosphatase: 51 U/L (ref 38–126)
Anion gap: 9 (ref 5–15)
BUN: 10 mg/dL (ref 6–20)
CO2: 24 mmol/L (ref 22–32)
Calcium: 8.9 mg/dL (ref 8.9–10.3)
Chloride: 104 mmol/L (ref 98–111)
Creatinine, Ser: 0.6 mg/dL (ref 0.44–1.00)
GFR calc Af Amer: >60 mL/min (ref >60)
GFR calc non Af Amer: >60 mL/min (ref >60)
GLUCOSE: 95 mg/dL (ref 70–99)
Potassium: 3.7 mmol/L (ref 3.5–5.1)
SODIUM: 137 mmol/L (ref 135–145)
Total Bilirubin: 0.4 mg/dL (ref 0.3–1.2)
Total Protein: 6.2 g/dL — ABNORMAL LOW (ref 6.5–8.1)

## 2018-09-14 LAB — CBC WITH DIFFERENTIAL/PLATELET
Abs Immature Granulocytes: 0.1 10*3/uL — ABNORMAL HIGH (ref 0.00–0.07)
Basophils Absolute: 0 10*3/uL (ref 0.0–0.1)
Basophils Relative: 0 %
Eosinophils Absolute: 0.1 10*3/uL (ref 0.0–0.5)
Eosinophils Relative: 1 %
HEMATOCRIT: 33.8 % — AB (ref 36.0–46.0)
Hemoglobin: 11 g/dL — ABNORMAL LOW (ref 12.0–15.0)
Immature Granulocytes: 1 %
LYMPHS ABS: 2.4 10*3/uL (ref 0.7–4.0)
Lymphocytes Relative: 20 %
MCH: 31 pg (ref 26.0–34.0)
MCHC: 32.5 g/dL (ref 30.0–36.0)
MCV: 95.2 fL (ref 80.0–100.0)
Monocytes Absolute: 0.7 10*3/uL (ref 0.1–1.0)
Monocytes Relative: 5 %
Neutro Abs: 9 10*3/uL — ABNORMAL HIGH (ref 1.7–7.7)
Neutrophils Relative %: 73 %
Platelets: 291 10*3/uL (ref 150–400)
RBC: 3.55 MIL/uL — ABNORMAL LOW (ref 3.87–5.11)
RDW: 13 % (ref 11.5–15.5)
WBC: 12.3 10*3/uL — ABNORMAL HIGH (ref 4.0–10.5)
nRBC: 0 % (ref 0.0–0.2)

## 2018-09-14 LAB — URINALYSIS, ROUTINE W REFLEX MICROSCOPIC
BACTERIA UA: NONE SEEN
Bilirubin Urine: NEGATIVE
Glucose, UA: NEGATIVE mg/dL
KETONES UR: NEGATIVE mg/dL
Leukocytes, UA: NEGATIVE
Nitrite: NEGATIVE
PH: 6 (ref 5.0–8.0)
PROTEIN: NEGATIVE mg/dL
Specific Gravity, Urine: 1.019 (ref 1.005–1.030)

## 2018-09-14 MED ORDER — SODIUM CHLORIDE 0.9 % IV BOLUS
500.0000 mL | Freq: Once | INTRAVENOUS | Status: AC
  Administered 2018-09-14: 500 mL via INTRAVENOUS

## 2018-09-14 NOTE — ED Notes (Signed)
Pt reports dysuria and hematuria with foul odor for the past month, lower abd pain that radiates to L side, some discharge, 20 weeks

## 2018-09-14 NOTE — Discharge Instructions (Addendum)
We saw you in the ER for the abdominal pain/pelvic pain. All the results in the ER are normal, labs and imaging. We are not sure what is causing your symptoms. The workup in the ER is not complete, and is limited to screening for life threatening and emergent conditions only, so please see a primary care doctor for further evaluation.  Return to the ER immediately if your pain becomes excruciating, you have associated vomiting, vaginal bleeding, sweating, confusion.  Otherwise see your OB doctor in 1 to 2 weeks as requested.

## 2018-09-15 LAB — URINE CULTURE

## 2018-09-15 NOTE — ED Provider Notes (Signed)
[facility] EMERGENCY DEPARTMENT Provider Note   CSN: [id number] Arrival date & time: 09/13/18  2217     History   Chief Complaint Chief Complaint  Patient presents with  . Dysuria    HPI Kayla Hardy is a 28 y.o. female.  HPI  28 year old G1 P0 woman comes in with chief complaint of abdominal pain.  Patient states that she has been having left-sided lower quadrant abdominal pain and suprapubic pain for the last several days.  She also started having some burning with urination today, prompting her to come to the ER.  Her pain has been going on for the past several days.  She has mentioned this to her OB/GYN in [place], however she states that no work-up is been done.  She has had pelvic exams which have been negative for any acute findings.  Patient denies any trauma or intercourse prior to this episode.  She has no UTI history and denies any nausea, vomiting, fevers, chills, vaginal bleeding or discharge.  Past Medical History:  Diagnosis Date  . Anemia    as child  . Anxiety   . Depression   . Hormone disorder   . 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 Pseudoseizure   . STD (sexually transmitted disease)    gonorrhea    Patient Active Problem List   Diagnosis Date Noted  . Chronic daily headache 06/01/2017  . Depression 06/01/2017  . Localization-related (focal) (partial) idiopathic epilepsy and epileptic syndromes with seizures of localized onset, not intractable, without status epilepticus (HCC) 11/26/2015  . Underweight 11/26/2015  . 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 Pseudoseizure   . Anxiety   . Rectal bleeding 05/03/2011    Past Surgical History:  Procedure Laterality Date  . COLONOSCOPY  05/16/2011   Procedure: COLONOSCOPY;  Surgeon: Arlyce Harman, MD;  Location: [facility];  Service: Endoscopy;  Laterality: N/A;  12:10  . TONSILLECTOMY   5th grade     OB History    Gravida  1   Para  0   Term  0   Preterm  0   AB  0   Living  0     SAB  0   TAB  0   Ectopic  0   Multiple  0   Live Births               Home Medications    Prior to Admission medications   Medication Sig Start Date End Date Taking? Authorizing Provider  citalopram (CELEXA) 40 MG tablet Take 1 tablet (40 mg total) by mouth daily. 05/13/18  Yes Van Clines, MD  lamoTRIgine (LAMICTAL) 200 MG tablet Take 1 tablet (200 mg total) by mouth 2 (two) times daily. 12/12/17  Yes Van Clines, MD  levothyroxine (SYNTHROID, LEVOTHROID) 100 MCG tablet Take 100 mcg by mouth daily before breakfast.   Yes [provider]  ibuprofen (ADVIL,MOTRIN) 400 MG tablet Take 1 tablet (400 mg total) by mouth every 6 (six) hours as needed. Patient not taking: Reported on 09/14/2018 06/10/14   Mellody Drown, PA-C  nortriptyline (PAMELOR) 25 MG capsule Take 1 capsule every night for 1 week, then 1 capsule every other night for 1 week, then stop. Patient not taking: Reported on 09/14/2018 06/24/18   Van Clines, MD  predniSONE (DELTASONE) 20 MG tablet Take 3 tabs on day 1, 2-1/2 tabs on day 2, 2 tabs on  day 3, 1-1/2 tabs on day 4, 1 tab on day 5, 1/2 tab on days 6 and 7, then stop Patient not taking: Reported on 09/14/2018 02/01/18   Van ClinesAquino, Karen M, MD  tiZANidine (ZANAFLEX) 2 MG tablet Take 1 tablet (2 mg total) by mouth every 8 (eight) hours as needed for muscle spasms. Patient not taking: Reported on 09/14/2018 01/23/17   Van ClinesAquino, Karen M, MD    Family History Family History  Problem Relation Age of Onset  . Diabetes Mellitus II Father   . Stroke Father        x4  . Hypertension Father   . Thyroid disease Father   . Diabetes Mellitus II Sister   . Thyroid disease Sister   . Thyroid disease Brother   . Autism Brother   . Thyroid disease Brother   . Autism Brother   . Colon cancer Neg Hx   . Seizures Neg Hx     Social History Social  History   Tobacco Use  . Smoking status: Former Smoker    Packs/day: 1.00    Types: Cigarettes    Last attempt to quit: 08/19/2015    Years since quitting: 3.0  . Smokeless tobacco: Never Used  Substance Use Topics  . Alcohol use: No    Alcohol/week: 0.0 standard drinks  . Drug use: No     Allergies   Demerol; Eggs or egg-derived products; Lactaid [lactase]; Shellfish allergy; and Keppra [levetiracetam]   Review of Systems Review of Systems  Constitutional: Positive for activity change.  Gastrointestinal: Positive for abdominal pain. Negative for nausea and vomiting.  Genitourinary: Positive for dysuria and pelvic pain. Negative for flank pain, vaginal bleeding and vaginal discharge.  Hematological: Does not bruise/bleed easily.  All other systems reviewed and are negative.    Physical Exam Updated Vital Signs BP 120/69 (BP Location: Right Arm)   Pulse 94   Temp 98.2 F (36.8 C) (Oral)   Resp 18   SpO2 100%   Physical Exam Vitals signs and nursing note reviewed.  Constitutional:      Appearance: She is well-developed.  HENT:     Head: Normocephalic and atraumatic.  Neck:     Musculoskeletal: Normal range of motion and neck supple.  Cardiovascular:     Rate and Rhythm: Normal rate.  Pulmonary:     Effort: Pulmonary effort is normal.  Abdominal:     General: Bowel sounds are normal. There is distension.     Tenderness: There is abdominal tenderness. There is no guarding or rebound.     Comments: Patient has left lower quadrant and suprapubic tenderness, positive flank tenderness.  Skin:    General: Skin is warm and dry.  Neurological:     Mental Status: She is alert and oriented to person, place, and time.      ED Treatments / Results  Labs (all labs ordered are listed, but only abnormal results are displayed) Labs Reviewed  URINALYSIS, ROUTINE W REFLEX MICROSCOPIC - Abnormal; Notable for the following components:      Result Value   Hgb urine  dipstick MODERATE (*)    All other components within normal limits  COMPREHENSIVE METABOLIC PANEL - Abnormal; Notable for the following components:   Total Protein 6.2 (*)    Albumin 3.4 (*)    AST 14 (*)    All other components within normal limits  CBC WITH DIFFERENTIAL/PLATELET - Abnormal; Notable for the following components:   WBC 12.3 (*)    RBC  3.55 (*)    Hemoglobin 11.0 (*)    HCT 33.8 (*)    Neutro Abs 9.0 (*)    Abs Immature Granulocytes 0.10 (*)    All other components within normal limits  URINE CULTURE    EKG None  Radiology US Ob Limited  Result Date: 09/14/2018 CLINICAL DATA:  Abdominal pain.  Twenty weeks pregnant. EXAM: LIMITED OBSTETRIC ULTRASOUND AND DOPPLER FINDINGS: Number of Fetuses: 1 Heart Rate:  136 bpm Movement: Yes Presentation: Breech Placental Location: Posterior Previa: No Amniotic Fluid (Subjective):  Within normal limits. BPD:  4.6cm 19w 5d MATERNAL FINDINGS: Cervix:  Appears closed. Uterus/Adnexae: No abnormality visualized. Pulsed Doppler evaluation of both ovaries demonstrates normal low-resistance arterial and venous waveforms. Other findings No free fluid. IMPRESSION: 1. Single live intrauterine pregnancy with estimated gestational age of [redacted] weeks and 5 days. 2. Maternal ovaries appear normal. No evidence of ovarian torsion. No mass or free fluid seen within either adnexal region. This exam is performed on an emergent basis and does not comprehensively evaluate fetal size, dating, or anatomy; follow-up complete OB US should be considered if further fetal assessment is warranted. Electronically Signed   By: Bary Richard M.D.   On: 09/14/2018 04:04   US Renal  Result Date: 09/14/2018 CLINICAL DATA:  Flank pain EXAM: RENAL / URINARY TRACT ULTRASOUND COMPLETE COMPARISON:  None. FINDINGS: Right Kidney: Renal measurements: 11.6 x 4.7 x 4.7 = volume: 131 mL . Echogenicity within normal limits. No mass or hydronephrosis visualized. Left Kidney: Renal  measurements: 11.3 x 5.0 x 5.5 cm = volume: 160 mL. Echogenicity within normal limits. No mass or hydronephrosis visualized. Bladder: Appears normal for degree of bladder distention. IMPRESSION: Normal renal ultrasound. Electronically Signed   By: Deatra Robinson M.D.   On: 09/14/2018 03:59   US Pelvic Doppler (torsion R/o Or Mass Arterial Flow)  Result Date: 09/14/2018 CLINICAL DATA:  Abdominal pain.  Twenty weeks pregnant. EXAM: LIMITED OBSTETRIC ULTRASOUND AND DOPPLER FINDINGS: Number of Fetuses: 1 Heart Rate:  136 bpm Movement: Yes Presentation: Breech Placental Location: Posterior Previa: No Amniotic Fluid (Subjective):  Within normal limits. BPD:  4.6cm 19w 5d MATERNAL FINDINGS: Cervix:  Appears closed. Uterus/Adnexae: No abnormality visualized. Pulsed Doppler evaluation of both ovaries demonstrates normal low-resistance arterial and venous waveforms. Other findings No free fluid. IMPRESSION: 1. Single live intrauterine pregnancy with estimated gestational age of [redacted] weeks and 5 days. 2. Maternal ovaries appear normal. No evidence of ovarian torsion. No mass or free fluid seen within either adnexal region. This exam is performed on an emergent basis and does not comprehensively evaluate fetal size, dating, or anatomy; follow-up complete OB US should be considered if further fetal assessment is warranted. Electronically Signed   By: Bary Richard M.D.   On: 09/14/2018 04:04    Procedures Procedures (including critical care time)  Medications Ordered in ED Medications  sodium chloride 0.9 % bolus 500 mL (0 mLs Intravenous Stopped 09/14/18 0427)     Initial Impression / Assessment and Plan / ED Course  I have reviewed the triage vital signs and the nursing notes.  Pertinent labs & imaging results that were available during my care of the patient were reviewed by me and considered in my medical decision making (see chart for details).  Clinical Course as of Sep 15 810  Sat Sep 14, 2018  4132  Results from the ER workup discussed with the patient face to face and all questions answered to the best of my ability.  US PELVIC DOPPLER (TORSION R/O OR MASS ARTERIAL FLOW) [AN]    Clinical Course User Index [AN] Derwood KaplanNanavati, Nassim Cosma, MD   Patient comes in with chief complaint of abdominal pain she is G1, P0 and about [redacted] weeks pregnant.  She has not had any complications with this pregnancy, however she has been having lower quadrant abdominal pain for the last several days.  She also has had intermittent dysuria.  Currently she is having dysuria, but she has had them in the past and was told that she does not have a UTI.  She denies any vaginal discharge or bleeding.  On exam there is no peritoneal findings.  The tenderness is located in the left lower quadrant and suprapubic region.  There is no tenderness over the right lower quadrant, upper abdominal quadrants or flank region.  Ultrasound pelvis ordered, it did not reveal any torsion, large cyst, hydronephrosis.  UA is completely clean, we will culture, however it does not seem like she has UTI because her dysuria is intermittent in nature.  Results of the ER discussed with the patient.  She does not want a pelvic exam, as her OB doctor and already completed that.  We will discharge her with recommendations of following up with OB.   Final Clinical Impressions(s) / ED Diagnoses   Final diagnoses:  Pelvic pain affecting pregnancy in second trimester, antepartum    ED Discharge Orders    None       Derwood KaplanNanavati, Abbygael Curtiss, MD 09/15/18 [phone]

## 2018-09-16 ENCOUNTER — Telehealth: Payer: Self-pay | Admitting: *Deleted

## 2018-09-16 NOTE — Telephone Encounter (Signed)
Post ED Visit - Positive Culture Follow-up  Culture report reviewed by antimicrobial stewardship pharmacist:  []  Enzo BiNathan Batchelder, Pharm.D. []  Celedonio MiyamotoJeremy Frens, Pharm.D., BCPS AQ-ID []  Garvin FilaMike Maccia, Pharm.D., BCPS []  Georgina PillionElizabeth Martin, Pharm.D., BCPS []  HarmonyMinh Pham, [address] BoulevardPharm.D., BCPS, AAHIVP []  Estella HuskMichelle Turner, Pharm.D., BCPS, AAHIVP [x]  Lysle Pearlachel Rumbarger, PharmD, BCPS []  Phillips Climeshuy Dang, PharmD, BCPS []  Agapito GamesAlison Masters, PharmD, BCPS []  Verlan FriendsErin Deja, PharmD  Positive urine culture Likely contaminant and no further patient follow-up is required at this time.  Virl AxeRobertson, Tashara Suder [facility] 09/16/2018, 9:13 AM

## 2018-09-21 ENCOUNTER — Other Ambulatory Visit: Payer: Self-pay | Admitting: Neurology

## 2018-09-21 DIAGNOSIS — G40009 Localization-related (focal) (partial) idiopathic epilepsy and epileptic syndromes with seizures of localized onset, not intractable, without status epilepticus: Secondary | ICD-10-CM

## 2018-10-25 ENCOUNTER — Ambulatory Visit: Payer: Self-pay | Admitting: Neurology

## 2018-11-18 ENCOUNTER — Telehealth: Payer: Self-pay | Admitting: Neurology

## 2018-11-18 NOTE — Telephone Encounter (Signed)
Reviewed Lamotrigine level on 07/22/2018: 5.3.

## 2019-03-11 ENCOUNTER — Telehealth (INDEPENDENT_AMBULATORY_CARE_PROVIDER_SITE_OTHER): Payer: Medicaid - Out of State | Admitting: Neurology

## 2019-03-11 ENCOUNTER — Other Ambulatory Visit: Payer: Self-pay

## 2019-03-11 ENCOUNTER — Encounter: Payer: Self-pay | Admitting: Neurology

## 2019-03-11 DIAGNOSIS — G40009 Localization-related (focal) (partial) idiopathic epilepsy and epileptic syndromes with seizures of localized onset, not intractable, without status epilepticus: Secondary | ICD-10-CM

## 2019-03-11 MED ORDER — LAMOTRIGINE 200 MG PO TABS: 200.0000 mg | ORAL_TABLET | Freq: Two times a day (BID) | ORAL | 3 refills | Status: AC

## 2019-03-11 NOTE — Progress Notes (Signed)
Virtual Visit via Video Note The purpose of this virtual visit is to provide medical care while limiting exposure to the novel coronavirus.    Consent was obtained for video visit:  Yes.   Answered questions that patient had about telehealth interaction:  Yes.   I discussed the limitations, risks, security and privacy concerns of performing an evaluation and management service by telemedicine. I also discussed with the patient that there may be a patient responsible charge related to this service. The patient expressed understanding and agreed to proceed.  Pt location: Home Physician Location: office Name of referring provider:  Bridget HartshornStephens, Amber Lea, DO I connected with Michel BickersShelayne R Sutley at patients initiation/request on 03/11/2019 at  1:30 PM EDT by video enabled telemedicine application and verified that I am speaking with the correct person using two identifiers. Pt MRN:  [id number] Pt DOB:  Jan 02, 1991 Video Participants:  [name] R Finks   History of Present Illness:  The patient was seen as a virtual video visit on 03/11/2019 for seizures and headaches. She was last seen 8 months ago. She delivered a healthy baby girl Madison last 6/2 without complications. She did well throughout her pregnancy with no seizures on Lamotrigine 200mg  BID. Last seizure was in January 2019. She was previously reporting frequent headaches, these have resolved, she has not had any headaches despite sleep deprivation. Sleep is better now. Mood is good as well, she feels that pregnancy restarted everything, she is not feeling as "crappy" as she used to. She is breastfeeding. No dizziness, vision changes, focal numbness/tingling/weakness, no falls.   History on Initial Assessment 11/26/15: This is a 28 yo RH woman with a history of anxiety and seizures. [facility]. She started having episodes in July 2012 after [place] She was feeling  unwell but went to work, then woke up in the hospital. After waking up, she was stuttering, numb on the right side, with cramping in the right hand and foot and severe headache. There is report of imaging and EEG done at that time. After coming home, she began having seizures that initially occurred every other day then slowed down. Per records, she had a seizure-free interval for 6 months in 2014. Since then, she reported a seizure once a week. She had seen 3 neurologists until she went to [facility], all of which had told her seizures were stress-related. She describes the seizures as starting with getting nervous and anxious, followed by a metallic taste lasting seconds to all day. She would then start having numbness and tingling in her right arm, then becomes unconscious. Sometimes she has episodes where she is "there but trapped in my body," she knows what is going on and can hear and comprehend, but could not control it. She would feel confused and weak after. She has been told she has times where she would have shaking all over and "flopping." She reports having urinary incontinence and tongue bite a couple of times with the seizures.During [facility], wake and sleep EEG was normal. There were 5 events captured consisting of an aura of pain or abnormal sensation in the head, then a scared look and at times variable degrees of stiffening of the arms and upper trunk with tachycardia that had no epileptiform correlates. It was felt that the stereotypical nature of the events and short duration raised concern for possible seizures with a deep focus vs  panic attacks vs less likely PNES. Based on the clinical and electrographic data, the patient's seizures are concerning for possible epileptic events with a deep seated focus precluding the expression of an epiletiform correlate on scalp EEG. She was discharged home on Topamax but had side effects of weight loss and paresthesias. She has  tried several AEDs with various side effects, Vimpat caused tongue itching and vomiting, making her feel "zombie-fied" and groggy. Depakote made her groggy. Trileptal made her feel dizzy and sedated. She had a rash a week after taking Keppra, but is unsure if rash was due to other cause. She was started on Zonisamide a year ago, instructed to take 3 capsules daily but this again caused her to feel "very zombie-ish." She takes 200mg  daily and reports weight loss, currently at 95 lbs.   She called our office last 01/11/16 to report 4 seizures in one day while at work, each lasting 1 minute but occurring back to back. Co-worker laid her on the couch but she apparently rolled over shaking and hit her head. There was associated urinary incontinence, no tongue bite. She was advised to increase Lamotrigine to 75mg  BID and continue Zonisamide 200mg  qhs. Since then she had only a "little" seizure in June. Zonegran was tapered off, and Lamictal increased to 100mg  BID.   Last 12/24/16, she called our office to report having several seizures in one day. Prior to this, her last seizure was in June 2017. She was not feeling good that day with nasal congestion and not sleeping well. She suddenly felt sick and dizzy, with a metal taste in her mouth.   She reports that since having the seizures, she has also been having more headaches. She would have worsening headaches 2-3 days before a seizure. She reports headaches and dizziness on a daily basis, with sharp pains in the back of her head or bilateral temporal regions. She feels nauseated and dizzy, "almost like I am drunk" with tunnel vision. Taking a nap helps. She takes Tylenol every 4 hours.   She is currently living with her boyfriend. 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 She denies any stress but [facility].   Diagnostic Data: I personally reviewed MRI brain with and  without contrast done 12/10/15 which was normal, hippocampi symmetric with no abnormal signal or enhancement.  Epilepsy Risk Factors: She had a normal birth and early development. There is no history of febrile convulsions, CNS infections such as meningitis/encephalitis, significant traumatic brain injury, neurosurgical procedures, or family history of seizures.  Prior AEDs: Topamax, Vimpat, Depakote, Trileptal, Keppra, Zonisamide    Current Outpatient Medications on File Prior to Visit  Medication Sig Dispense Refill  . citalopram (CELEXA) 40 MG tablet Take 1 tablet (40 mg total) by mouth daily. 30 tablet 11  . cyclobenzaprine (FLEXERIL) 5 MG tablet TAKE ONE TABLET UP TO THREE TIMES DAILY FOR HEADACHE    . folic acid (FOLVITE) 1 MG tablet TAKE FIVE TABLETS BY MOUTH EVERY DAY    . ibuprofen (ADVIL,MOTRIN) 400 MG tablet Take 1 tablet (400 mg total) by mouth every 6 (six) hours as needed. 30 tablet 0  . lamoTRIgine (LAMICTAL) 200 MG tablet TAKE ONE TABLET BY MOUTH TWICE DAILY 60 tablet 5  . levothyroxine (SYNTHROID) 100 MCG tablet Take 100 mcg by mouth 2 (two) times a day.     No current facility-administered medications on file prior to  visit.      Observations/Objective:   Vitals:   03/11/19 1301  BP: 108/60  Weight: 164 lb (74.4 kg)  Height: 5\' 4"  (1.626 m)   GEN:  The patient appears stated age and is in NAD.  Neurological examination: Patient is awake, alert, oriented x 3. No aphasia or dysarthria. Intact fluency and comprehension. Remote and recent memory intact. Able to name and repeat. Cranial nerves: Extraocular movements intact with no nystagmus. No facial asymmetry. Motor: moves all extremities symmetrically, at least anti-gravity x 4.  Assessment and Plan:   This is a 28 yo RH woman with a history of anxiety and seizures. Although some of her seizures are concerning for possible non-epileptic events (PNES), records from [place] were reviewed and although EEG was normal  throughout her stay, episodes captured were felt to be stereotypical in nature and of short duration, raising concern for possible seizures with a deep seated focus precluding the expression of an epileptiform correlate on scalp EEG focus vs panic attacks vs less likely PNES. MRI brain normal. She has been doing well on Lamotrigine 200mg  BID, seizure-free since January 2019. Headaches have resolved as well, she has not needed nortriptyline or any prn medication. She is aware of  driving laws to stop driving after a seizure, until 6 months seizure-free. She will follow-up in 6-8 months and knows to call for any changes.    Follow Up Instructions:   -I discussed the assessment and treatment plan with the patient. The patient was provided an opportunity to ask questions and all were answered. The patient agreed with the plan and demonstrated an understanding of the instructions.   The patient was advised to call back or seek an in-person evaluation if the symptoms worsen or if the condition fails to improve as anticipated.     Cameron Sprang, MD

## 2019-04-07 ENCOUNTER — Other Ambulatory Visit: Payer: Self-pay | Admitting: Neurology

## 2019-04-21 ENCOUNTER — Telehealth: Payer: Self-pay | Admitting: Neurology

## 2019-04-21 NOTE — Telephone Encounter (Signed)
Patient left msg with after hours about having some bad headaches that will not resolve. Wanting the DR to prescribe something to help. Thanks!

## 2019-04-22 NOTE — Telephone Encounter (Signed)
When did the headaches start? How many hours of sleep is she getting? Any triggers, has she been sick with cough/fever/sick contacts? Are they similar to her headaches in the past? Does she want to do a virtual visit tomorrow at 11am? Thanks

## 2019-04-22 NOTE — Telephone Encounter (Addendum)
IBU every 800mg  q4hours  Wakes up in the morning with them. They tapper off and come back just as bad.  Pt is breast feeding. Baby is 51m!  Trying to drink more water. Helps some wate. Pt has so have some caffine.   Pt does get nauseous  D/C citalopram and added Zoloft qhs (started it last Friday and taking just have a tablet) This Friday she will start taking a whole tablet  Pt will call back with pharmacy name in [facility]

## 2019-04-22 NOTE — Telephone Encounter (Signed)
Left message for pt to return call.  Phone does not have good reception.  May be use email instead?

## 2019-04-22 NOTE — Telephone Encounter (Signed)
Left message for pt to return call.  Breasfeeding? Recent pregnancy.  Avoiding triggers?  What is she taking to help?

## 2019-09-29 ENCOUNTER — Encounter: Payer: Self-pay | Admitting: Neurology

## 2019-10-14 ENCOUNTER — Ambulatory Visit: Payer: Medicaid - Out of State | Admitting: Neurology
